# Patient Record
Sex: Male | Born: 1955 | Race: White | Hispanic: No | Marital: Married | Smoking: Never smoker
Health system: Southern US, Community
[De-identification: ages and names within clinical notes are randomized; demographics above are authoritative.]

## PROBLEM LIST (undated history)

## (undated) DIAGNOSIS — G4700 Insomnia, unspecified: Secondary | ICD-10-CM

## (undated) DIAGNOSIS — S86019A Strain of unspecified Achilles tendon, initial encounter: Secondary | ICD-10-CM

## (undated) DIAGNOSIS — Z9889 Other specified postprocedural states: Secondary | ICD-10-CM

## (undated) DIAGNOSIS — G4733 Obstructive sleep apnea (adult) (pediatric): Principal | ICD-10-CM

## (undated) DIAGNOSIS — R159 Full incontinence of feces: Secondary | ICD-10-CM

## (undated) DIAGNOSIS — S0291XA Unspecified fracture of skull, initial encounter for closed fracture: Secondary | ICD-10-CM

## (undated) DIAGNOSIS — K648 Other hemorrhoids: Secondary | ICD-10-CM

## (undated) DIAGNOSIS — R112 Nausea with vomiting, unspecified: Secondary | ICD-10-CM

## (undated) DIAGNOSIS — H269 Unspecified cataract: Secondary | ICD-10-CM

## (undated) DIAGNOSIS — K649 Unspecified hemorrhoids: Secondary | ICD-10-CM

## (undated) DIAGNOSIS — F419 Anxiety disorder, unspecified: Secondary | ICD-10-CM

## (undated) DIAGNOSIS — C801 Malignant (primary) neoplasm, unspecified: Secondary | ICD-10-CM

## (undated) DIAGNOSIS — T7840XA Allergy, unspecified, initial encounter: Secondary | ICD-10-CM

## (undated) HISTORY — DX: Unspecified fracture of skull, initial encounter for closed fracture: S02.91XA

## (undated) HISTORY — DX: Unspecified hemorrhoids: K64.9

## (undated) HISTORY — DX: Other specified postprocedural states: Z98.890

## (undated) HISTORY — DX: Malignant (primary) neoplasm, unspecified: C80.1

## (undated) HISTORY — PX: HEMORRHOID BANDING: SHX5850

## (undated) HISTORY — DX: Obstructive sleep apnea (adult) (pediatric): G47.33

## (undated) HISTORY — DX: Nausea with vomiting, unspecified: R11.2

## (undated) HISTORY — DX: Insomnia, unspecified: G47.00

## (undated) HISTORY — PX: COLONOSCOPY: SHX174

## (undated) HISTORY — DX: Other hemorrhoids: K64.8

## (undated) HISTORY — PX: KNEE CARTILAGE SURGERY: SHX688

## (undated) HISTORY — PX: SIGMOIDOSCOPY: SUR1295

## (undated) HISTORY — PX: ACHILLES TENDON REPAIR: SUR1153

## (undated) HISTORY — DX: Strain of unspecified achilles tendon, initial encounter: S86.019A

## (undated) HISTORY — DX: Allergy, unspecified, initial encounter: T78.40XA

## (undated) HISTORY — DX: Full incontinence of feces: R15.9

## (undated) HISTORY — DX: Unspecified cataract: H26.9

## (undated) HISTORY — DX: Anxiety disorder, unspecified: F41.9

---

## 1998-12-21 ENCOUNTER — Emergency Department (HOSPITAL_COMMUNITY): Admission: EM | Admit: 1998-12-21 | Discharge: 1998-12-21 | Payer: Self-pay | Admitting: Emergency Medicine

## 1998-12-22 ENCOUNTER — Encounter: Payer: Self-pay | Admitting: Emergency Medicine

## 2004-03-19 ENCOUNTER — Ambulatory Visit: Payer: Self-pay | Admitting: Family Medicine

## 2004-10-19 ENCOUNTER — Ambulatory Visit: Payer: Self-pay | Admitting: Family Medicine

## 2004-10-28 ENCOUNTER — Ambulatory Visit: Payer: Self-pay | Admitting: Family Medicine

## 2004-12-29 ENCOUNTER — Encounter: Admission: RE | Admit: 2004-12-29 | Discharge: 2004-12-29 | Payer: Self-pay | Admitting: Orthopedic Surgery

## 2005-07-21 ENCOUNTER — Ambulatory Visit: Payer: Self-pay | Admitting: Family Medicine

## 2005-11-04 ENCOUNTER — Encounter: Admission: RE | Admit: 2005-11-04 | Discharge: 2005-11-04 | Payer: Self-pay | Admitting: Orthopedic Surgery

## 2006-01-23 ENCOUNTER — Ambulatory Visit: Payer: Self-pay | Admitting: Family Medicine

## 2006-02-07 ENCOUNTER — Encounter: Admission: RE | Admit: 2006-02-07 | Discharge: 2006-02-07 | Payer: Self-pay | Admitting: Orthopedic Surgery

## 2006-02-22 ENCOUNTER — Ambulatory Visit: Payer: Self-pay | Admitting: Family Medicine

## 2006-02-22 LAB — CONVERTED CEMR LAB
Albumin: 3.6 g/dL (ref 3.5–5.2)
BUN: 13 mg/dL (ref 6–23)
Basophils Relative: 1.3 % — ABNORMAL HIGH (ref 0.0–1.0)
Bilirubin, Direct: 0.1 mg/dL (ref 0.0–0.3)
Calcium: 9.4 mg/dL (ref 8.4–10.5)
Creatinine, Ser: 0.8 mg/dL (ref 0.4–1.5)
GFR calc non Af Amer: 109 mL/min
HCT: 41.4 % (ref 39.0–52.0)
HDL: 48.2 mg/dL (ref >39.0)
LDL Cholesterol: 117 mg/dL — ABNORMAL HIGH (ref 0–99)
Lymphocytes Relative: 10 % — ABNORMAL LOW (ref 12.0–46.0)
MCV: 87.8 fL (ref 78.0–100.0)
Monocytes Absolute: 0.6 10*3/uL (ref 0.2–0.7)
Monocytes Relative: 6.4 % (ref 3.0–11.0)
PSA: 0.26 ng/mL (ref 0.10–4.00)
Platelets: 199 10*3/uL (ref 150–400)
Potassium: 4.3 meq/L (ref 3.5–5.1)
Total Bilirubin: 0.6 mg/dL (ref 0.3–1.2)
Total Protein: 6.7 g/dL (ref 6.0–8.3)
Triglycerides: 42 mg/dL (ref 0–149)
WBC: 9 10*3/uL (ref 4.5–10.5)

## 2006-03-09 ENCOUNTER — Ambulatory Visit: Payer: Self-pay | Admitting: Family Medicine

## 2006-05-26 ENCOUNTER — Ambulatory Visit: Payer: Self-pay | Admitting: Family Medicine

## 2006-11-08 ENCOUNTER — Ambulatory Visit: Payer: Self-pay | Admitting: Family Medicine

## 2006-11-08 DIAGNOSIS — J029 Acute pharyngitis, unspecified: Secondary | ICD-10-CM

## 2006-11-08 LAB — CONVERTED CEMR LAB: Rapid Strep: NEGATIVE

## 2007-01-12 ENCOUNTER — Encounter: Admission: RE | Admit: 2007-01-12 | Discharge: 2007-01-12 | Payer: Self-pay | Admitting: Orthopedic Surgery

## 2007-02-08 ENCOUNTER — Encounter: Admission: RE | Admit: 2007-02-08 | Discharge: 2007-02-08 | Payer: Self-pay | Admitting: Orthopedic Surgery

## 2007-04-09 DIAGNOSIS — S838X9A Sprain of other specified parts of unspecified knee, initial encounter: Secondary | ICD-10-CM

## 2007-04-09 DIAGNOSIS — S86819A Strain of other muscle(s) and tendon(s) at lower leg level, unspecified leg, initial encounter: Secondary | ICD-10-CM

## 2007-04-11 ENCOUNTER — Ambulatory Visit: Payer: Self-pay | Admitting: Family Medicine

## 2007-09-06 ENCOUNTER — Ambulatory Visit: Payer: Self-pay | Admitting: Family Medicine

## 2007-09-06 LAB — CONVERTED CEMR LAB
ALT: 18 units/L (ref 0–53)
Albumin: 3.8 g/dL (ref 3.5–5.2)
Basophils Relative: 0.2 % (ref 0.0–3.0)
Bilirubin Urine: NEGATIVE
Blood in Urine, dipstick: NEGATIVE
CO2: 27 meq/L (ref 19–32)
Chloride: 108 meq/L (ref 96–112)
Cholesterol: 203 mg/dL (ref 0–200)
Creatinine, Ser: 1 mg/dL (ref 0.4–1.5)
Direct LDL: 117.3 mg/dL
Glucose, Urine, Semiquant: NEGATIVE
HCT: 38.8 % — ABNORMAL LOW (ref 39.0–52.0)
HDL: 51.7 mg/dL (ref >39.0)
MCHC: 34.6 g/dL (ref 30.0–36.0)
MCV: 88.4 fL (ref 78.0–100.0)
Neutro Abs: 2.5 10*3/uL (ref 1.4–7.7)
PSA: 0.26 ng/mL (ref 0.10–4.00)
Potassium: 4 meq/L (ref 3.5–5.1)
Protein, U semiquant: NEGATIVE
Total Bilirubin: 0.8 mg/dL (ref 0.3–1.2)
Total Protein: 6.2 g/dL (ref 6.0–8.3)
Urobilinogen, UA: 0.2
VLDL: 10 mg/dL (ref 0–40)
WBC: 4.7 10*3/uL (ref 4.5–10.5)

## 2007-09-13 ENCOUNTER — Ambulatory Visit: Payer: Self-pay | Admitting: Family Medicine

## 2007-09-18 ENCOUNTER — Ambulatory Visit: Payer: Self-pay | Admitting: Gastroenterology

## 2007-10-17 ENCOUNTER — Ambulatory Visit: Payer: Self-pay | Admitting: Gastroenterology

## 2008-03-03 ENCOUNTER — Telehealth: Payer: Self-pay | Admitting: Family Medicine

## 2009-01-29 ENCOUNTER — Ambulatory Visit: Payer: Self-pay | Admitting: Family Medicine

## 2009-01-29 DIAGNOSIS — G43009 Migraine without aura, not intractable, without status migrainosus: Secondary | ICD-10-CM

## 2009-01-29 DIAGNOSIS — G4720 Circadian rhythm sleep disorder, unspecified type: Secondary | ICD-10-CM

## 2009-01-29 LAB — CONVERTED CEMR LAB
Basophils Absolute: 0 10*3/uL (ref 0.0–0.1)
Bilirubin, Direct: 0.1 mg/dL (ref 0.0–0.3)
Calcium: 9.6 mg/dL (ref 8.4–10.5)
Chloride: 105 meq/L (ref 96–112)
Creatinine, Ser: 0.9 mg/dL (ref 0.4–1.5)
Eosinophils Relative: 0.5 % (ref 0.0–5.0)
Glucose, Bld: 110 mg/dL — ABNORMAL HIGH (ref 70–99)
HCT: 43.8 % (ref 39.0–52.0)
Hemoglobin: 14.4 g/dL (ref 13.0–17.0)
Lymphocytes Relative: 23.4 % (ref 12.0–46.0)
MCHC: 32.8 g/dL (ref 30.0–36.0)
MCV: 90.4 fL (ref 78.0–100.0)
Neutrophils Relative %: 64 % (ref 43.0–77.0)
PSA: 0.33 ng/mL (ref 0.10–4.00)
Platelets: 178 10*3/uL (ref 150.0–400.0)
Potassium: 4.3 meq/L (ref 3.5–5.1)
RBC: 4.85 M/uL (ref 4.22–5.81)
RDW: 12.9 % (ref 11.5–14.6)
Sodium: 141 meq/L (ref 135–145)
TSH: 1.16 microintl units/mL (ref 0.35–5.50)
Total CHOL/HDL Ratio: 5
Total Protein: 7 g/dL (ref 6.0–8.3)
VLDL: 13.4 mg/dL (ref 0.0–40.0)
WBC: 5 10*3/uL (ref 4.5–10.5)

## 2009-03-20 ENCOUNTER — Ambulatory Visit: Payer: Self-pay | Admitting: Family Medicine

## 2009-03-20 DIAGNOSIS — J309 Allergic rhinitis, unspecified: Secondary | ICD-10-CM | POA: Insufficient documentation

## 2009-04-16 ENCOUNTER — Ambulatory Visit: Payer: Self-pay | Admitting: Family Medicine

## 2009-06-24 ENCOUNTER — Ambulatory Visit: Payer: Self-pay | Admitting: Family Medicine

## 2009-11-02 ENCOUNTER — Ambulatory Visit: Payer: Self-pay | Admitting: Family Medicine

## 2009-11-02 DIAGNOSIS — M79609 Pain in unspecified limb: Secondary | ICD-10-CM | POA: Insufficient documentation

## 2009-11-03 ENCOUNTER — Telehealth: Payer: Self-pay | Admitting: Family Medicine

## 2010-02-09 NOTE — Progress Notes (Signed)
Summary: Pt req to get copy of xray sent over to Dr. Budd Palmer office  Phone Note Call from Patient Call back at Work Phone [phone]   Caller: Patient Summary of Call: Pt is req that a copy of his xray results for pts had be sent to Dr Teressa Senter with [facility]. Pt has appt with Dr. Teressa Senter tomorrow and Dr Teressa Senter wants to see xray during appt. Pls call. Pt says that is this can be sent electroinically, that would be great. Otherwise, he would like to pick up copy to take with him. Please notify pt.   Initial call taken by: Lucy Antigua,  November 03, 2009 3:04 PM  Follow-up for Phone Call        fax sent Follow-up by: Kern Reap CMA Duncan Dull),  November 03, 2009 4:46 PM  Additional Follow-up for Phone Call Additional follow up Details #1::        Phone Call Completed Additional Follow-up by: Kern Reap CMA Duncan Dull),  November 03, 2009 4:50 PM

## 2010-02-09 NOTE — Assessment & Plan Note (Signed)
Summary: COUGH, CONGESTION / RS   Vital Signs:  Patient profile:   55 year old male Weight:      149 pounds Temp:     98.6 degrees F oral BP sitting:   102 / 64  (left arm) Cuff size:   regular  Vitals Entered By: Kern Reap CMA Duncan Dull) (March 20, 2009 12:34 PM)  Reason for Visit sinus pressure, ear pressure, sore throat  Primary Care Provider:  Kelle Darting, MD   History of Present Illness: William Norris is a 55 year old male with a 5-day history of head congestion, postnasal drip, cough, sinus pressure, and popping in his ears.  He has no symptoms of a virus.  He said a history of seasonal allergic rhinitis, which is typically worse in the spring.  Allergic history reviewed.  Environmental history negative  Allergies: No Known Drug Allergies  Past History:  Past medical, surgical, family and social histories (including risk factors) reviewed for relevance to current acute and chronic problems.  Past Medical History: Reviewed history from 11/08/2006 and no changes required.  migraines knee surgery x 2.  Cartilage  skull fracture.  No neurologic sequelae torn right Achilles' tendon  Family History: Reviewed history from 09/18/2007 and no changes required. brother with glaucoma father died at age 5, CNS embolus.  Smoker mother has osteoporosis one sister in good health Family History of Irritable Bowel Syndrome: Mother   Social History: Reviewed history from 09/18/2007 and no changes required. Occupation:[employer] Married Never Smoked Alcohol use-Rare Drug use-no Regular exercise-yes Daily Caffeine Use 2-3 coffee/day  Review of Systems      See HPI  Physical Exam  General:  Well-developed,well-nourished,in no acute distress; alert,appropriate and cooperative throughout examination Head:  Normocephalic and atraumatic without obvious abnormalities. No apparent alopecia or balding. Eyes:  No corneal or conjunctival inflammation noted. EOMI. Perrla. Funduscopic  exam benign, without hemorrhages, exudates or papilledema. Vision grossly normal. Ears:  External ear exam shows no significant lesions or deformities.  Otoscopic examination reveals clear canals, tympanic membranes are intact bilaterally without bulging, retraction, inflammation or discharge. Hearing is grossly normal bilaterally. Nose:  septum in the midline, 3+ nasal edema Mouth:  Oral mucosa and oropharynx without lesions or exudates.  Teeth in good repair. Neck:  No deformities, masses, or tenderness noted. Chest Wall:  No deformities, masses, tenderness or gynecomastia noted. Lungs:  Normal respiratory effort, chest expands symmetrically. Lungs are clear to auscultation, no crackles or wheezes.   Problems:  Medical Problems Added: 1)  Dx of Rhinitis  (ICD-477.9)  Impression & Recommendations:  Problem # 1:  RHINITIS (ICD-477.9) Assessment New  His updated medication list for this problem includes:    Flonase 50 Mcg/act Susp (Fluticasone propionate) ..... Uad  Orders: Prescription Created Electronically [phone])  Complete Medication List: 1)  Zomig Zmt 5 Mg Tbdp (Zolmitriptan) .... As needed miraine 2)  Doxycycline Hyclate 50 Mg Caps (Doxycycline hyclate) .... As needed for skin 3)  Adult Aspirin Ec Low Strength 81 Mg Tbec (Aspirin) .... Take 1 tablet by mouth once a day 4)  Multivitamins Tabs (Multiple vitamin) .... Take 1 tablet by mouth once a day 5)  Celexa 20 Mg Tabs (Citalopram hydrobromide) .Marland Kitchen.. 1 tab @ bedtime 6)  Flonase 50 Mcg/act Susp (Fluticasone propionate) .... Uad  Patient Instructions: 1)  take 10 mg of plain Claritin in the morning or 10 mg of plain Zyrtec at bedtime, along with the nasal irrigation......... afrin nasal spray........ saltwater irrigation with a netti pot......... steroid nasal spray, one  shot up each nostril, then go immediately to bed . 2)  If in a week u  don't see any improvement, then, we would start you on a short course of oral  prednisone........... call if that's the case Prescriptions: FLONASE 50 MCG/ACT SUSP (FLUTICASONE PROPIONATE) UAD  #1 x 6   Entered and Authorized by:   Roderick Pee MD   Signed by:   Roderick Pee MD on 03/20/2009   Method used:   Electronically to        [facility] [address] [phone]* (retail)       [address]       [place], [place]  [address]       Ph: [phone] or [phone]       Fax: [phone]   RxID:   [id number]
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
## 2010-02-09 NOTE — Assessment & Plan Note (Signed)
Summary: CPX // RS   Vital Signs:  Patient profile:   55 year old male Height:      67 inches Weight:      143 pounds Temp:     98.0 degrees F oral BP sitting:   110 / 72  (left arm) Cuff size:   regular  Vitals Entered By: Kern Reap CMA Duncan Dull) (April 16, 2009 10:51 AM) CC: cpx   Primary Care Provider:  Kelle Darting, MD  CC:  cpx.  History of Present Illness: William Norris is a 55 year old, married male, nonsmoker avid mountain biker who comes in today for general physical exam.  As always been in excellent health.  Has had no chronic health problems except migraine headaches.  He takes Zomig p.r.n. and it works if he takes it right away.  He also takes Celexa 20 mg nightly for sleep dysfunction.  Sleeping well.  He also uses Flonase nasal spray p.r.n. for allergic rhinitis.  He takes an aspirin tablet daily.  He gets routine eye care.  Dental care.  Colonoscopy done in GI normal, tetanus, 2006, seasonal flu shot 2010  On his mountain bike.  He gets his heart rate up to [address]180.  No chest pain, however, if he gets over 180.  He states he gets a little short of breath.  Allergies (verified): No Known Drug Allergies  Past History:  Past medical, surgical, family and social histories (including risk factors) reviewed, and no changes noted (except as noted below).  Past Medical History: Reviewed history from 11/08/2006 and no changes required.  migraines knee surgery x 2.  Cartilage  skull fracture.  No neurologic sequelae torn right Achilles' tendon  Family History: Reviewed history from 09/18/2007 and no changes required. brother with glaucoma father died at age 54, CNS embolus.  Smoker mother has osteoporosis one sister in good health Family History of Irritable Bowel Syndrome: Mother   Social History: Reviewed history from 09/18/2007 and no changes required. Occupation:[employer] Married Never Smoked Alcohol use-Rare Drug use-no Regular exercise-yes Daily Caffeine  Use 2-3 coffee/day  Review of Systems      See HPI  Physical Exam  General:  Well-developed,well-nourished,in no acute distress; alert,appropriate and cooperative throughout examination Head:  Normocephalic and atraumatic without obvious abnormalities. No apparent alopecia or balding. Eyes:  No corneal or conjunctival inflammation noted. EOMI. Perrla. Funduscopic exam benign, without hemorrhages, exudates or papilledema. Vision grossly normal. Ears:  External ear exam shows no significant lesions or deformities.  Otoscopic examination reveals clear canals, tympanic membranes are intact bilaterally without bulging, retraction, inflammation or discharge. Hearing is grossly normal bilaterally. Nose:  External nasal examination shows no deformity or inflammation. Nasal mucosa are pink and moist without lesions or exudates. Mouth:  Oral mucosa and oropharynx without lesions or exudates.  Teeth in good repair. Neck:  No deformities, masses, or tenderness noted. Chest Wall:  No deformities, masses, tenderness or gynecomastia noted. Breasts:  No masses or gynecomastia noted Lungs:  Normal respiratory effort, chest expands symmetrically. Lungs are clear to auscultation, no crackles or wheezes. Heart:  Normal rate and regular rhythm. S1 and S2 normal without gallop, murmur, click, rub or other extra sounds. Abdomen:  Bowel sounds positive,abdomen soft and non-tender without masses, organomegaly or hernias noted. Rectal:  No external abnormalities noted. Normal sphincter tone. No rectal masses or tenderness. Genitalia:  Testes bilaterally descended without nodularity, tenderness or masses. No scrotal masses or lesions. No penis lesions or urethral discharge. Prostate:  Prostate gland firm and smooth,  no enlargement, nodularity, tenderness, mass, asymmetry or induration. Msk:  No deformity or scoliosis noted of thoracic or lumbar spine.   Pulses:  R and L carotid,radial,femoral,dorsalis pedis and  posterior tibial pulses are full and equal bilaterally Extremities:  No clubbing, cyanosis, edema, or deformity noted with normal full range of motion of all joints.   Neurologic:  No cranial nerve deficits noted. Station and gait are normal. Plantar reflexes are down-going bilaterally. DTRs are symmetrical throughout. Sensory, motor and coordinative functions appear intact. Skin:  Intact without suspicious lesions or rashes Cervical Nodes:  No lymphadenopathy noted Axillary Nodes:  No palpable lymphadenopathy Inguinal Nodes:  No significant adenopathy Psych:  Cognition and judgment appear intact. Alert and cooperative with normal attention span and concentration. 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 As needed miraine    Adult Aspirin Ec Low Strength 81 Mg Tbec (Aspirin) .Marland Kitchen... Take 1 tablet by mouth once a day  Orders: Prescription Created Electronically [phone]) EKG w/ Interpretation (93000)  Problem # 3:  HEALTH MAINTENANCE EXAM (ICD-V70.0) Assessment: Unchanged  Orders: Prescription Created Electronically [phone])  Complete Medication List: 1)  Zomig Zmt 5 Mg Tbdp (Zolmitriptan) .... As needed miraine 2)  Adult Aspirin Ec Low Strength 81 Mg Tbec (Aspirin) .... Take 1 tablet by mouth once a day 3)  Multivitamins Tabs (Multiple vitamin) .... Take 1 tablet by mouth once a day 4)  Celexa 20 Mg Tabs (Citalopram hydrobromide) .Marland Kitchen.. 1 tab @ bedtime 5)  Flonase 50 Mcg/act Susp (Fluticasone propionate) .... Uad  Patient Instructions: 1)  Please schedule a follow-up appointment in 1 year. 2)  Take  an Aspirin every day. Prescriptions: FLONASE 50 MCG/ACT SUSP (FLUTICASONE PROPIONATE) UAD  #1 x 6   Entered and Authorized by:   Roderick Pee MD   Signed by:   Roderick Pee MD on 04/16/2009   Method used:   Electronically to        [facility] [address] [phone]* (retail)       [address]       [place], [place]  [address]       Ph: [phone] or [phone]       Fax: [phone]   RxID:   [id number] CELEXA 20 MG TABS (CITALOPRAM HYDROBROMIDE) 1 tab @ bedtime  #100 x 3   Entered and Authorized by:   Roderick Pee MD   Signed by:   Roderick Pee MD on 04/16/2009   Method used:   Electronically to        [facility] [address] [phone]* (retail)       [address]       [place], [place]  [address]       Ph: [phone] or [phone]       Fax: [phone]   RxID:   [id number] ZOMIG ZMT 5 MG  TBDP (ZOLMITRIPTAN) as needed miraine  #6 Tablet x 11   Entered and Authorized by:   Roderick Pee MD   Signed by:   Roderick Pee MD  on 04/16/2009   Method used:   Electronically to        [facility] [address] [phone]* (retail)       [address]       [place], [place]  [address]       Ph: [phone] or [phone]       Fax: [phone]   RxID:   [id number]    Immunization History:  Influenza Immunization History:    Influenza:  historical (10/10/2008)  Tetanus/Td Immunization History:    Tetanus/Td:  historical (01/11/2004)

## 2010-02-09 NOTE — Assessment & Plan Note (Signed)
Summary: med check/refills/cjr   Vital Signs:  Patient profile:   55 year old male Height:      67.25 inches Weight:      147 pounds BMI:     22.94 Temp:     98.7 degrees F oral BP sitting:   118 / 76  (left arm) Cuff size:   regular  Vitals Entered By: Kern Reap CMA Duncan Dull) (January 29, 2009 11:39 AM)  Reason for Visit med check, sleep concerns  Primary Care Provider:  Kelle Darting, MD   History of Present Illness: William Norris is a 55 year old, married male, who comes in today for evaluation of 3 problems.  We lasted his physical examination about two years ago.  His underlying migraine headaches for which he takes Zomig 5 mg stat.  His migraines occur about every two to 3 months.  They usually wake him up in the middle of the night.  However, if he takes a Zomig, right away.  The headache goes away.  Is also having difficulty sleeping.  He goes to sleep okay but wakes up and can go back to sleep.  He is currently taking Benadryl nightly however, he says it makes him groggy in the morning.  He would like to discuss other options.  The third problem is change in bowel habits.  For the last 9 months.  He says his bowel movements.  He got another loose.  He only goes once daily.  No blood in the stools and had a normal colonoscopy.  Last year.  No fevss, etc..  Allergies: No Known Drug Allergies  Past History:  Past medical, surgical, family and social histories (including risk factors) reviewed, and no changes noted (except as noted below).  Past Medical History: Reviewed history from 11/08/2006 and no changes required.  migraines knee surgery x 2.  Cartilage  skull fracture.  No neurologic sequelae torn right Achilles' tendon  Family History: Reviewed history from 09/18/2007 and no changes required. brother with glaucoma father died at age 23, CNS embolus.  Smoker mother has osteoporosis one sister in good health Family History of Irritable Bowel Syndrome: Mother    Social History: Reviewed history from 09/18/2007 and no changes required. Occupation:[employer] Married Never Smoked Alcohol use-Rare Drug use-no Regular exercise-yes Daily Caffeine Use 2-3 coffee/day  Review of Systems      See HPI  Physical Exam  General:  Well-developed,well-nourished,in no acute distress; alert,appropriate and cooperative throughout examination Psych:  Cognition and judgment appear intact. Alert and cooperative with normal attention span and concentration. 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 As needed miraine    Adult Aspirin Ec Low Strength 81 Mg Tbec (Aspirin) .Marland Kitchen... Take 1 tablet by mouth once a day  Orders: Venipuncture (09811) Prescription Created Electronically [phone]) UA Dipstick w/o Micro (automated)  (81003)  Complete Medication List: 1)  Zomig Zmt 5 Mg Tbdp (Zolmitriptan) .... As needed miraine 2)  Doxycycline Hyclate 50 Mg Caps (Doxycycline hyclate) .... As needed for skin 3)  Adult Aspirin Ec Low Strength 81 Mg Tbec (Aspirin) .... Take 1 tablet by mouth once a day 4)  Multivitamins Tabs (Multiple vitamin) .Marland KitchenMarland KitchenMarland Kitchen  Take 1 tablet by mouth once a day 5)  Celexa 20 Mg Tabs (Citalopram hydrobromide) .Marland Kitchen.. 1 tab @ bedtime  Other Orders: TLB-Lipid Panel (80061-LIPID) TLB-BMP (Basic Metabolic Panel-BMET) (80048-METABOL) TLB-CBC Platelet - w/Differential (85025-CBCD) TLB-Hepatic/Liver Function Pnl (80076-HEPATIC) TLB-TSH (Thyroid Stimulating Hormone) (84443-TSH) TLB-PSA (Prostate Specific Antigen) (84153-PSA)  Patient  Instructions: 1)  continued the Zomig as you are currently taking it. 2)  Begin Celexa 20 mg one half tablet nightly if after 4 to 6 weeks  u are sleeping well.  Continue a half a tablet.  If however, after 4 to 6 weeks u are  not sleeping well.  Increase the dose to a full 20-mg tablet at bedtime.  3)  We will get your lab work today.  Set up a time in the next 3 to 4 weeks for a 30 minute appointment for general medical exam Prescriptions: ZOMIG ZMT 5 MG  TBDP (ZOLMITRIPTAN) as needed miraine  #6 Tablet x 11   Entered and Authorized by:   Roderick Pee MD   Signed by:   Roderick Pee MD on 01/29/2009   Method used:   Electronically to        [facility] [address] [phone]* (retail)       [address]       [place], [place]  [address]       Ph: [phone] or [phone]       Fax: [phone]   RxID:   [id number] CELEXA 20 MG TABS (CITALOPRAM HYDROBROMIDE) 1 tab @ bedtime  #100 x 3   Entered and Authorized by:   Roderick Pee MD   Signed by:   Roderick Pee MD on 01/29/2009   Method used:   Electronically to        [facility] [address] [phone]* (retail)       [address]       [place], [place]  [address]       Ph: [phone] or [phone]       Fax: [phone]   RxID:   [id number]   Appended Document: med check/refills/cjr  Laboratory Results   Urine Tests    Routine Urinalysis   Color: yellow Appearance: Clear Glucose: negative   (Normal Range: Negative) Bilirubin: negative   (Normal Range: Negative) Ketone: negative   (Normal Range: Negative) Spec. Gravity: 1.015   (Normal Range: 1.003-1.035) Blood: negative   (Normal Range: Negative) pH: 7.0   (Normal Range: 5.0-8.0) Protein: negative   (Normal Range: Negative) Urobilinogen: 0.2   (Normal Range: 0-1) Nitrite: negative   (Normal Range: Negative) Leukocyte Esterace: negative   (Normal Range: Negative)    Comments: Rita Ohara  January 30, 2009 8:16 AM

## 2010-02-09 NOTE — Assessment & Plan Note (Signed)
Summary: congestion//ccm   Vital Signs:  Patient profile:   55 year old male Weight:      147 pounds Temp:     98.1 degrees F oral BP sitting:   100 / 60  (left arm) Cuff size:   regular  Vitals Entered By: Kathrynn Speed CMA (June 24, 2009 4:11 PM) CC: Sinus Congestion   History of Present Illness: 10 day history sinus congestion. Green nasal mucus. No fever. Intermittent chills. Sore throat. Occasional mild productive cough. Has taken over-the-counter antihistamines without improvement. Nonsmoker. No significant headache  Current Medications (verified): 1)  Zomig Zmt 5 Mg  Tbdp (Zolmitriptan) .... As Needed Miraine 2)  Adult Aspirin Ec Low Strength 81 Mg Tbec (Aspirin) .... Take 1 Tablet By Mouth Once A Day 3)  Multivitamins  Tabs (Multiple Vitamin) .... Take 1 Tablet By Mouth Once A Day 4)  Celexa 20 Mg Tabs (Citalopram Hydrobromide) .Marland Kitchen.. 1 Tab @ Bedtime 5)  Flonase 50 Mcg/act Susp (Fluticasone Propionate) .... Uad  Allergies (verified): No Known Drug Allergies  Past History:  Past Medical History: Last updated: 11/08/2006  migraines knee surgery x 2.  Cartilage  skull fracture.  No neurologic sequelae torn right Achilles' tendon  Review of Systems      See HPI  Physical Exam  General:  Well-developed,well-nourished,in no acute distress; alert,appropriate and cooperative throughout examination Head:  Normocephalic and atraumatic without obvious abnormalities. No apparent alopecia or balding. Ears:  External ear exam shows no significant lesions or deformities.  Otoscopic examination reveals clear canals, tympanic membranes are intact bilaterally without bulging, retraction, inflammation or discharge. Hearing is grossly normal bilaterally. Nose:  erythematous nasal mucosa otherwise normal Mouth:  minimal erythema without exudate Neck:  No deformities, masses, or tenderness noted. Lungs:  Normal respiratory effort, chest expands symmetrically. Lungs are clear to  auscultation, no crackles or wheezes. Heart:  Normal rate and regular rhythm. S1 and S2 normal without gallop, murmur, click, rub or other extra sounds.   Impression & Recommendations:  Problem # 1:  SINUSITIS, ACUTE (ICD-461.9) ?viral but symptoms > 10 days.  Start Amox. if symptoms not improved in 2-3 days. His updated medication list for this problem includes:    Flonase 50 Mcg/act Susp (Fluticasone propionate) ..... Uad    Amoxicillin 875 Mg Tabs (Amoxicillin) ..... One by mouth two times a day for 10 days  Complete Medication List: 1)  Zomig Zmt 5 Mg Tbdp (Zolmitriptan) .... As needed miraine 2)  Adult Aspirin Ec Low Strength 81 Mg Tbec (Aspirin) .... Take 1 tablet by mouth once a day 3)  Multivitamins Tabs (Multiple vitamin) .... Take 1 tablet by mouth once a day 4)  Celexa 20 Mg Tabs (Citalopram hydrobromide) .Marland Kitchen.. 1 tab @ bedtime 5)  Flonase 50 Mcg/act Susp (Fluticasone propionate) .... Uad 6)  Amoxicillin 875 Mg Tabs (Amoxicillin) .... One by mouth two times a day for 10 days  Patient Instructions: 1)  Acute sinusitis symptoms for less than 10 days are not helped by antibiotics. Use warm moist compresses, and over the counter decongestants( only as directed). Call if no improvement in 5-7 days, sooner if increasing pain, fever, or new symptoms.  Prescriptions: AMOXICILLIN 875 MG TABS (AMOXICILLIN) one by mouth two times a day for 10 days  #20 x 0   Entered and Authorized by:   Evelena Peat MD   Signed by:   Evelena Peat MD on 06/24/2009   Method used:   Print then Give to Patient   RxID:  [id number]  

## 2010-06-08 ENCOUNTER — Other Ambulatory Visit: Payer: Self-pay | Admitting: Family Medicine

## 2010-08-02 ENCOUNTER — Encounter: Payer: Self-pay | Admitting: Family Medicine

## 2010-08-02 ENCOUNTER — Ambulatory Visit (INDEPENDENT_AMBULATORY_CARE_PROVIDER_SITE_OTHER): Payer: BC Managed Care – PPO | Admitting: Family Medicine

## 2010-08-02 VITALS — BP 94/62 | HR 64 | Temp 97.8°F | Ht 67.0 in | Wt 148.0 lb

## 2010-08-02 DIAGNOSIS — M5136 Other intervertebral disc degeneration, lumbar region with discogenic back pain only: Secondary | ICD-10-CM | POA: Insufficient documentation

## 2010-08-02 DIAGNOSIS — M545 Low back pain: Secondary | ICD-10-CM

## 2010-08-02 MED ORDER — CYCLOBENZAPRINE HCL 5 MG PO TABS: ORAL_TABLET | ORAL | Status: DC

## 2010-08-02 MED ORDER — TRAMADOL HCL 50 MG PO TABS: ORAL_TABLET | ORAL | Status: DC

## 2010-08-02 MED ORDER — PREDNISONE 20 MG PO TABS: ORAL_TABLET | ORAL | Status: DC

## 2010-08-02 NOTE — Progress Notes (Signed)
  Subjective:    Patient ID: William Norris, male    DOB: 1955/10/17, 55 y.o.   MRN: [id number]  HPIGery is a [age]-year-old, married male, nonsmoker, who comes in today for evaluation of left lower lumbar back pain for two weeks.  Two weeks ago.  He was playing basketball with his son and experienced left lower lumbar back pain.  Since that time.  The pain as being constant, but it varies in intensity from a to do is 6.  It sometimes keeps him awake at night.  He describes it as dull, not sharp and does not radiate.  He said a history of cervical disk disease in the past that resolved with physical therapy, but alternately had had epidural steroid injections.    Review of Systems    General no logic, and musculoskeletal review of systems otherwise negative Objective:   Physical Exam    Well-developed thin male, in no acute distress.  Examination lumbar spine.  Chest no palpable tenderness to this palpable tenderness.  The left SI notch.  In the supine position.  Muscle strength reflexes, sensation, all normal.  Straight leg raising negative    Assessment & Plan:  Lumbar disease.  Plan prednisone burst and taper physical therapy consider epidural steroid injections.  If symptoms persist

## 2010-08-02 NOTE — Patient Instructions (Signed)
Take the prednisone as directed.  One half or one of the Flexeril and Ultram at bedtime as needed.  We will get she is set up for physical therapy.  If your symptoms persist after physical therapy return to see me for follow-up

## 2011-05-29 ENCOUNTER — Other Ambulatory Visit: Payer: Self-pay | Admitting: Family Medicine

## 2011-12-17 ENCOUNTER — Other Ambulatory Visit: Payer: Self-pay | Admitting: Family Medicine

## 2011-12-19 ENCOUNTER — Encounter: Payer: Self-pay | Admitting: Family Medicine

## 2011-12-19 ENCOUNTER — Ambulatory Visit (INDEPENDENT_AMBULATORY_CARE_PROVIDER_SITE_OTHER): Payer: BC Managed Care – PPO | Admitting: Family Medicine

## 2011-12-19 VITALS — BP 120/80 | Temp 98.2°F | Wt 150.0 lb

## 2011-12-19 DIAGNOSIS — Z23 Encounter for immunization: Secondary | ICD-10-CM

## 2011-12-19 DIAGNOSIS — M25519 Pain in unspecified shoulder: Secondary | ICD-10-CM

## 2011-12-19 DIAGNOSIS — M25512 Pain in left shoulder: Secondary | ICD-10-CM | POA: Insufficient documentation

## 2011-12-19 DIAGNOSIS — M5136 Other intervertebral disc degeneration, lumbar region: Secondary | ICD-10-CM

## 2011-12-19 DIAGNOSIS — M545 Low back pain: Secondary | ICD-10-CM

## 2011-12-19 MED ORDER — TRAMADOL HCL 50 MG PO TABS: ORAL_TABLET | ORAL | Status: DC

## 2011-12-19 MED ORDER — PREDNISONE 20 MG PO TABS: ORAL_TABLET | ORAL | Status: DC

## 2011-12-19 NOTE — Progress Notes (Signed)
  Subjective:    Patient ID: William Norris, male    DOB: [birth year], 56 y.o.   MRN: [id number]  HPI Press is a 56 year old male married nonsmoker who comes in with a 3 to four-month history of pain in his left shoulder no history of trauma. He states he was swimming in the next day noticed some soreness in his shoulder and it's not gone away. His pain varies from a 2 to was 7.   Review of Systems General and musculoskeletal review of systems otherwise negative    Objective:   Physical Exam Well-developed well-nourished male in no acute distress examination of the shoulder shows full range of motion some tenderness in the shoulder joint itself otherwise normal exam       Assessment & Plan:  Pain left shoulder probably a little bursitis plan prednisone burst and taper return when necessary

## 2011-12-19 NOTE — Patient Instructions (Signed)
Take a short course of prednisone as outlined  If we don't see any improvement then I would recommend a consult with Dr. Norlene Campbell orthopedist

## 2012-02-10 ENCOUNTER — Ambulatory Visit (INDEPENDENT_AMBULATORY_CARE_PROVIDER_SITE_OTHER): Payer: BC Managed Care – PPO | Admitting: Family

## 2012-02-10 ENCOUNTER — Encounter: Payer: Self-pay | Admitting: Family

## 2012-02-10 VITALS — BP 100/78 | HR 72 | Temp 98.1°F | Wt 151.0 lb

## 2012-02-10 DIAGNOSIS — J019 Acute sinusitis, unspecified: Secondary | ICD-10-CM

## 2012-02-10 DIAGNOSIS — R5383 Other fatigue: Secondary | ICD-10-CM

## 2012-02-10 MED ORDER — AMOXICILLIN 500 MG PO TABS: 1000.0000 mg | ORAL_TABLET | Freq: Two times a day (BID) | ORAL | Status: AC

## 2012-02-10 NOTE — Progress Notes (Signed)
Subjective:    Patient ID: William Norris, male    DOB: [birth year], 56 y.o.   MRN: [id number]  HPI 57 year old white male, nonsmoker, patient of Dr. Tawanna Cooler is in today with complaints of congestion, sore throat, sinus pressure and pain x2-1/2 weeks. 3-4 days ago his symptoms appear to be clearing uses Sudafed and aspirin but today, his symptoms returned and are worse. Reports tenderness to his face. Denies any fever, muscle aches, or joint aches.   Review of Systems  Constitutional: Positive for fatigue.  HENT: Positive for congestion, sore throat, rhinorrhea, postnasal drip and sinus pressure.   Respiratory: Positive for cough.   Cardiovascular: Negative.   Musculoskeletal: Negative.   Skin: Negative.   Neurological: Negative.   Hematological: Negative.   Psychiatric/Behavioral: Negative.    Past Medical History  Diagnosis Date  . Migraine   . Skull fracture     no neurologic sequelae  . Torn Achilles tendon     right    History   Social History  . Marital Status: Married    Spouse Name: N/A    Number of Children: N/A  . Years of Education: N/A   Occupational History  . Not on file.   Social History Main Topics  . Smoking status: Never Smoker   . Smokeless tobacco: Not on file  . Alcohol Use:   . Drug Use:   . Sexually Active:    Other Topics Concern  . Not on file   Social History Narrative  . No narrative on file    Past Surgical History  Procedure Date  . Knee cartilage surgery     x2    Family History  Problem Relation Age of Onset  . Osteoporosis Mother   . Irritable bowel syndrome Mother   . Glaucoma Brother     No Known Allergies  Current Outpatient Prescriptions on File Prior to Visit  Medication Sig Dispense Refill  . aspirin 81 MG tablet Take 81 mg by mouth daily.        . multivitamin (THERAGRAN) per tablet Take 1 tablet by mouth daily.        . traMADol (ULTRAM) 50 MG tablet One p.o. nightly  30 tablet  1  . zolmitriptan (ZOMIG-ZMT) 5  MG disintegrating tablet TAKE AS NEEDED FOR MIGRAINE  30 tablet  3  . cyclobenzaprine (FLEXERIL) 5 MG tablet One p.o. nightly  30 tablet  1  . fluticasone (FLONASE) 50 MCG/ACT nasal spray Place 2 sprays into the nose daily.        . predniSONE (DELTASONE) 20 MG tablet Two tabs x 3 days, one x 3 days, a half x 3 days, then half a tablet Monday, Wednesday, Friday, for a two-week taper  40 tablet  1    BP 100/78  Pulse 72  Temp 98.1 F (36.7 C) (Oral)  Wt 151 lb (68.493 kg)  SpO2 98%chart    Objective:   Physical Exam  Constitutional: He is oriented to person, place, and time. He appears well-developed and well-nourished.  HENT:  Right Ear: External ear normal.  Left Ear: External ear normal.       Maxillary sinus tenderness to palpation bilaterally  Neck: Normal range of motion. Neck supple.  Cardiovascular: Normal rate, regular rhythm and normal heart sounds.   Pulmonary/Chest: Effort normal and breath sounds normal.  Neurological: He is alert and oriented to person, place, and time.  Skin: Skin is warm and dry.  Psychiatric: He has a normal mood  and affect.          Assessment & Plan:  Assessment:  1. Acute sinusitis 2. Fatigue  Plan: Flonase 2 sprays in each nostril once a day. Patient reports having this medication at home. Amoxicillin 2 capsules by mouth twice a day x10 days. Rest. Drink plenty of fluids. Patient call the office if symptoms worsen or persist. Recheck a schedule, and as needed.

## 2012-02-10 NOTE — Patient Instructions (Addendum)

## 2012-12-23 ENCOUNTER — Other Ambulatory Visit: Payer: Self-pay | Admitting: Family Medicine

## 2013-02-22 ENCOUNTER — Ambulatory Visit (INDEPENDENT_AMBULATORY_CARE_PROVIDER_SITE_OTHER): Payer: BC Managed Care – PPO | Admitting: Family

## 2013-02-22 ENCOUNTER — Encounter: Payer: Self-pay | Admitting: Family

## 2013-02-22 VITALS — BP 100/62 | HR 72 | Wt 154.0 lb

## 2013-02-22 DIAGNOSIS — M7661 Achilles tendinitis, right leg: Secondary | ICD-10-CM

## 2013-02-22 DIAGNOSIS — M7660 Achilles tendinitis, unspecified leg: Secondary | ICD-10-CM

## 2013-02-22 MED ORDER — MELOXICAM 15 MG PO TABS: 15.0000 mg | ORAL_TABLET | Freq: Every day | ORAL | Status: DC

## 2013-02-22 NOTE — Progress Notes (Signed)
   Subjective:    Patient ID: William Norris, male    DOB: 1955/05/11, 58 y.o.   MRN: [id number]  Foot Injury    58 year old white male, nonsmoker is in today with complaints of right Achilles heel pain that initially started 3 weeks ago after playing basketball. The symptoms seemed to have resolved then 2 days ago he was playing basketball again and developed a shooting pain in his right Achilles heel. He has a history of a ruptured Achilles tendon 25 years ago that was repaired. Recent pain is 6/10 worse with standing and certain movements. He is able to ambulate and bear weight. He has not taken any medication for relief.   Review of Systems  Constitutional: Negative.   Respiratory: Negative.   Genitourinary: Negative.   Musculoskeletal:       Right achilles heel pain   Skin: Negative.   Neurological: Negative.   Hematological: Negative.   Psychiatric/Behavioral: Negative.        Objective:   Physical Exam  Constitutional: He is oriented to person, place, and time. He appears well-developed and well-nourished.  Neck: Normal range of motion. Neck supple.  Cardiovascular: Normal rate, regular rhythm and normal heart sounds.   Pulmonary/Chest: Effort normal and breath sounds normal.  Musculoskeletal: He exhibits tenderness. He exhibits no edema.  Right achilles heel pain to palpation. No obvious swelling. Full ROM without pain.   Neurological: He is alert and oriented to person, place, and time.  Skin: Skin is warm and dry.  Psychiatric: He has a normal mood and affect.          Assessment & Plan:  William Norris was seen today for foot injury.  Diagnoses and associated orders for this visit:  Tendonitis, Achilles, right  Other Orders - meloxicam (MOBIC) 15 MG tablet; Take 1 tablet (15 mg total) by mouth daily.    Call the office if any questions or concerns. Consider MRI if symptoms persist.

## 2013-02-22 NOTE — Progress Notes (Signed)
Pre visit review using our clinic review tool, if applicable. No additional management support is needed unless otherwise documented below in the visit note. 

## 2013-02-22 NOTE — Patient Instructions (Signed)
Achilles Tendinitis °Achilles tendinitis is inflammation of the tough, cord-like band that attaches the lower muscles of your leg to your heel (Achilles tendon). It is usually caused by overusing the tendon and joint involved.  °CAUSES °Achilles tendinitis can happen because of: °· A sudden increase in exercise or activity (such as running). °· Doing the same exercises or activities (such as jumping) over and over. °· Not warming up calf muscles before exercising. °· Exercising in shoes that are worn out or not made for exercise. °· Having arthritis or a bone growth on the back of the heel bone. This can rub against the tendon and hurt the tendon. °SIGNS AND SYMPTOMS °The most common symptoms are: °· Pain in the back of the leg, just above the heel. The pain usually gets worse with exercise and better with rest. °· Stiffness or soreness in the back of the leg, especially in the morning. °· Swelling of the skin over the Achilles tendon. °· Trouble standing on tiptoe. °Sometimes, an Achilles tendon tears (ruptures). Symptoms of an Achilles tendon rupture can include: °· Sudden, severe pain in the back of the leg. °· Trouble putting weight on the foot or walking normally. °DIAGNOSIS °Achilles tendinitis will be diagnosed based on symptoms and a physical examination. An X-ray may be done to check if another condition is causing your symptoms. An MRI may be ordered if your health care provider suspects you may have completely torn your tendon, which is called an Achilles tendon rupture.  °TREATMENT  °Achilles tendinitis usually gets better over time. It can take weeks to months to heal completely. Treatment focuses on treating the symptoms and helping the injury heal. °HOME CARE INSTRUCTIONS  °· Rest your Achilles tendon and avoid activities that cause pain. °· Apply ice to the injured area: °· Put ice in a plastic bag. °· Place a towel between your skin and the bag. °· Leave the ice on for 20 minutes, 2 3 times a  day °· Try to avoid using the tendon (other than gentle range of motion) while the tendon is painful. Do not resume use until instructed by your health care provider. Then begin use gradually. Do not increase use to the point of pain. If pain does develop, decrease use and continue the above measures. Gradually increase activities that do not cause discomfort until you achieve normal use. °· Do exercises to make your calf muscles stronger and more flexible. Your health care provider or physical therapist can recommend exercises for you to do. °· Wrap your ankle with an elastic bandage or other wrap. This can help keep your tendon from moving too much. Your health care provider will show you how to wrap your ankle correctly. °· Only take over-the-counter or prescription medicines for pain, discomfort, or fever as directed by your health care provider. °SEEK MEDICAL CARE IF:  °· Your pain and swelling increase or pain is uncontrolled with medicines. °· You develop new, unexplained symptoms or your symptoms get worse. °· You are unable to move your toes or foot. °· You develop warmth and swelling in your foot. °· You have an unexplained temperature. °MAKE SURE YOU:  °· Understand these instructions. °· Will watch your condition. °· Will get help right away if you are not doing well or get worse. °Document Released: 10/06/2004 Document Revised: 10/17/2012 Document Reviewed: 08/08/2012 °[employer]® Patient Information ©2014 [employer]. ° °

## 2013-03-05 ENCOUNTER — Ambulatory Visit (INDEPENDENT_AMBULATORY_CARE_PROVIDER_SITE_OTHER): Payer: BC Managed Care – PPO | Admitting: Family Medicine

## 2013-03-05 ENCOUNTER — Encounter: Payer: Self-pay | Admitting: Internal Medicine

## 2013-03-05 ENCOUNTER — Encounter: Payer: BC Managed Care – PPO | Admitting: Internal Medicine

## 2013-03-05 DIAGNOSIS — R059 Cough, unspecified: Secondary | ICD-10-CM

## 2013-03-05 DIAGNOSIS — R05 Cough: Secondary | ICD-10-CM

## 2013-03-05 DIAGNOSIS — J329 Chronic sinusitis, unspecified: Secondary | ICD-10-CM

## 2013-03-05 MED ORDER — AZITHROMYCIN 250 MG PO TABS: ORAL_TABLET | ORAL | Status: DC

## 2013-03-05 NOTE — Progress Notes (Signed)
Document opened and reviewed for OV but appt  canceled same day . Seen by other provider  weather

## 2013-03-05 NOTE — Patient Instructions (Signed)
-  As we discussed, we have prescribed a new medication for you at this appointment. We discussed the common and serious potential adverse effects of this medication and you can review these and more with the pharmacist when you pick up your medication.  Please follow the instructions for use carefully and notify us immediately if you have any problems taking this medication.  -follow up if symptoms worsen or persist or you have new concerns

## 2013-03-05 NOTE — Progress Notes (Signed)
No chief complaint on file.   HPI:  -started: 2 weeks ago -symptoms:nasal congestion, sore throat, cough, sinus pain, sinus pressure, some nausea from coughing yesterday -denies:fever, SOB -has tried: musinex -sick contacts/travel/risks: denies flu exposure or Ebola risks  ROS: See pertinent positives and negatives per HPI.  Past Medical History  Diagnosis Date  . Migraine   . Skull fracture     no neurologic sequelae  . Torn Achilles tendon     right    Past Surgical History  Procedure Laterality Date  . Knee cartilage surgery      x2    Family History  Problem Relation Age of Onset  . Osteoporosis Mother   . Irritable bowel syndrome Mother   . Glaucoma Brother     History   Social History  . Marital Status: Married    Spouse Name: N/A    Number of Children: N/A  . Years of Education: N/A   Social History Main Topics  . Smoking status: Never Smoker   . Smokeless tobacco: Not on file  . Alcohol Use:   . Drug Use:   . Sexual Activity:    Other Topics Concern  . Not on file   Social History Narrative  . No narrative on file    Current outpatient prescriptions:aspirin 81 MG tablet, Take 81 mg by mouth daily.  , Disp: , Rfl: ;  azithromycin (ZITHROMAX) 250 MG tablet, 2 tabs on day one then 1 tab daily for 4 more days, Disp: 6 tablet, Rfl: 0;  cyclobenzaprine (FLEXERIL) 5 MG tablet, One p.o. nightly, Disp: 30 tablet, Rfl: 1;  fluticasone (FLONASE) 50 MCG/ACT nasal spray, Place 2 sprays into the nose daily.  , Disp: , Rfl:  meloxicam (MOBIC) 15 MG tablet, Take 1 tablet (15 mg total) by mouth daily., Disp: 30 tablet, Rfl: 0;  multivitamin (THERAGRAN) per tablet, Take 1 tablet by mouth daily.  , Disp: , Rfl: ;  traMADol (ULTRAM) 50 MG tablet, One p.o. nightly, Disp: 30 tablet, Rfl: 1;  zolmitriptan (ZOMIG-ZMT) 5 MG disintegrating tablet, TAKE 1 TABLET BY MOUTH AS NEEDED FOR MIGRAINE, Disp: 30 tablet, Rfl: 0  EXAM:  There were no vitals filed for this  visit.  There is no weight on file to calculate BMI.  GENERAL: vitals reviewed and listed above, alert, oriented, appears well hydrated and in no acute distress  HEENT: atraumatic, conjunttiva clear, no obvious abnormalities on inspection of external nose and ears, normal appearance of ear canals and TMs, clear nasal congestion, mild post oropharyngeal erythema with PND, no tonsillar edema or exudate, no sinus TTP  NECK: no obvious masses on inspection  LUNGS: course breath sound R middle lobe  CV: HRRR, no peripheral edema  MS: moves all extremities without noticeable abnormality  PSYCH: pleasant and cooperative, no obvious depression or anxiety  ASSESSMENT AND PLAN:  Discussed the following assessment and plan:  Sinusitis - Plan: azithromycin (ZITHROMAX) 250 MG tablet  Cough - Plan: azithromycin (ZITHROMAX) 250 MG tablet  -We discussed potential etiologies, with course breath sounds R middle lobe and tooth pain - discussed likely viral but potential for CAP, sinusitis. - We discussed treatment side effects, likely course, antibiotic misuse, transmission, and signs of developing a serious illness. -opted for tx with abx, return precuations -of course, we advised to return or notify a doctor immediately if symptoms worsen or persist or new concerns arise.    Patient Instructions  -As we discussed, we have prescribed a new medication for you  at this appointment. We discussed the common and serious potential adverse effects of this medication and you can review these and more with the pharmacist when you pick up your medication.  Please follow the instructions for use carefully and notify us immediately if you have any problems taking this medication.  -follow up if symptoms worsen or persist or you have new concerns     Xzavier Swinger R.

## 2013-03-11 ENCOUNTER — Ambulatory Visit (INDEPENDENT_AMBULATORY_CARE_PROVIDER_SITE_OTHER): Payer: BC Managed Care – PPO | Admitting: Family Medicine

## 2013-03-11 ENCOUNTER — Encounter: Payer: Self-pay | Admitting: Family Medicine

## 2013-03-11 VITALS — BP 102/80 | Temp 97.8°F | Wt 154.0 lb

## 2013-03-11 DIAGNOSIS — M542 Cervicalgia: Secondary | ICD-10-CM

## 2013-03-11 NOTE — Progress Notes (Signed)
Pre visit review using our clinic review tool, if applicable. No additional management support is needed unless otherwise documented below in the visit note. 

## 2013-03-11 NOTE — Progress Notes (Signed)
Chief Complaint  Patient presents with  . Neck Pain    HPI:  William Norris is a patient of Dr. Sherren Mocha, here for acute visit for neck pain: -started 3-4 days ago after running through airport with bags -in SCM muscle -seen in emergency room yesterday and CT scan done and blood test (told no infection, and CT scan normal) and treated with naproxen and flexeril which has helped -had a cold recently and is recovering from that but still has a sore throat at times -denies: fevers, vision changes, weakness, headache   ROS: See pertinent positives and negatives per HPI.  Past Medical History  Diagnosis Date  . Migraine   . Skull fracture     no neurologic sequelae  . Torn Achilles tendon     right    Past Surgical History  Procedure Laterality Date  . Knee cartilage surgery      x2    Family History  Problem Relation Age of Onset  . Osteoporosis Mother   . Irritable bowel syndrome Mother   . Glaucoma Brother     History   Social History  . Marital Status: Married    Spouse Name: N/A    Number of Children: N/A  . Years of Education: N/A   Social History Main Topics  . Smoking status: Never Smoker   . Smokeless tobacco: None  . Alcohol Use:   . Drug Use:   . Sexual Activity:    Other Topics Concern  . None   Social History Narrative  . None    Current outpatient prescriptions:aspirin 81 MG tablet, Take 81 mg by mouth daily.  , Disp: , Rfl: ;  cyclobenzaprine (FLEXERIL) 5 MG tablet, One p.o. nightly, Disp: 30 tablet, Rfl: 1;  fluticasone (FLONASE) 50 MCG/ACT nasal spray, Place 2 sprays into the nose daily.  , Disp: , Rfl: ;  meloxicam (MOBIC) 15 MG tablet, Take 1 tablet (15 mg total) by mouth daily., Disp: 30 tablet, Rfl: 0 multivitamin (THERAGRAN) per tablet, Take 1 tablet by mouth daily.  , Disp: , Rfl: ;  traMADol (ULTRAM) 50 MG tablet, One p.o. nightly, Disp: 30 tablet, Rfl: 1;  zolmitriptan (ZOMIG-ZMT) 5 MG disintegrating tablet, TAKE 1 TABLET BY MOUTH AS NEEDED  FOR MIGRAINE, Disp: 30 tablet, Rfl: 0  EXAM:  Filed Vitals:   03/11/13 1046  BP: 102/80  Temp: 97.8 F (36.6 C)    Body mass index is 24.11 kg/(m^2).  GENERAL: vitals reviewed and listed above, alert, oriented, appears well hydrated and in no acute distress  HEENT: atraumatic, conjunttiva clear, no obvious abnormalities on inspection of external nose and ears, normal appearance of ear canals and TMs, clear nasal congestion, mild post oropharyngeal erythema with PND, no tonsillar edema or exudate, no sinus TTP  NECK: no obvious masses on inspection, ROM mildly limited due to pain, TTP exactly along R SCM and not elsewhere, no carotid artery sensitivity or bruits, no bony TTP, neg spurling  MS: moves all extremities without noticeable abnormality  PSYCH: pleasant and cooperative, no obvious depression or anxiety  ASSESSMENT AND PLAN:  Discussed the following assessment and plan:  Neck pain  -we discussed possible serious and likely etiologies, workup and treatment, treatment risks and return precautions -after this discussion, William Norris opted for conservative tx of likely SCM muscle strain with HEP, heat, flexeril and naproxen -follow up advised as needed and emergency precautions discussed -of course, we advised William Norris  to return or notify a doctor immediately if symptoms  worsen or persist or new concerns arise.  -Patient advised to return or notify a doctor immediately if symptoms worsen or persist or new concerns arise.  Patient Instructions  -heat 15 minutes twice daily  -exercises provided  -medications as instructed as needed  -follow up as needed if persists or worsens     KIM, HANNAH R.

## 2013-03-11 NOTE — Patient Instructions (Signed)
-  heat 15 minutes twice daily  -exercises provided  -medications as instructed as needed  -follow up as needed if persists or worsens

## 2013-03-22 ENCOUNTER — Encounter: Payer: Self-pay | Admitting: Physician Assistant

## 2013-03-22 ENCOUNTER — Ambulatory Visit (INDEPENDENT_AMBULATORY_CARE_PROVIDER_SITE_OTHER)
Admission: RE | Admit: 2013-03-22 | Discharge: 2013-03-22 | Disposition: A | Discharge status: Home / Self Care | Payer: BC Managed Care – PPO | Source: Ambulatory Visit | Attending: Physician Assistant | Admitting: Physician Assistant

## 2013-03-22 ENCOUNTER — Ambulatory Visit (INDEPENDENT_AMBULATORY_CARE_PROVIDER_SITE_OTHER): Payer: BC Managed Care – PPO | Admitting: Physician Assistant

## 2013-03-22 VITALS — BP 116/80 | HR 70 | Temp 97.9°F | Wt 151.6 lb

## 2013-03-22 DIAGNOSIS — R059 Cough, unspecified: Secondary | ICD-10-CM

## 2013-03-22 DIAGNOSIS — J329 Chronic sinusitis, unspecified: Secondary | ICD-10-CM

## 2013-03-22 DIAGNOSIS — R05 Cough: Secondary | ICD-10-CM

## 2013-03-22 MED ORDER — HYDROCODONE-HOMATROPINE 5-1.5 MG/5ML PO SYRP: 5.0000 mL | ORAL_SOLUTION | Freq: Three times a day (TID) | ORAL | Status: DC | PRN

## 2013-03-22 MED ORDER — LEVOFLOXACIN 500 MG PO TABS: 500.0000 mg | ORAL_TABLET | Freq: Every day | ORAL | Status: DC

## 2013-03-22 NOTE — Patient Instructions (Addendum)
It was great meeting you today William Norris!  I have sent prescriptions to your pharmacy, please take as directed. I have ordered a chest xray, please report to the radiology department downstairs.  Cough, Adult  A cough is a reflex. It helps you clear your throat and airways. A cough can help heal your body. A cough can last 2 or 3 weeks (acute) or may last more than 8 weeks (chronic). Some common causes of a cough can include an infection, allergy, or a cold. HOME CARE  Only take medicine as told by your doctor.  If given, take your medicines (antibiotics) as told. Finish them even if you start to feel better.  Use a cold steam vaporizer or humidier in your home. This can help loosen thick spit (secretions).  Sleep so you are almost sitting up (semi-upright). Use pillows to do this. This helps reduce coughing.  Rest as needed.  Stop smoking if you smoke. GET HELP RIGHT AWAY IF:  You have yellowish-white fluid (pus) in your thick spit.  Your cough gets worse.  Your medicine does not reduce coughing, and you are losing sleep.  You cough up blood.  You have trouble breathing.  Your pain gets worse and medicine does not help.  You have a fever. MAKE SURE YOU:   Understand these instructions.  Will watch your condition.  Will get help right away if you are not doing well or get worse. Document Released: 09/09/2010 Document Revised: 03/21/2011 Document Reviewed: 09/09/2010 [facility] Patient Information 2014 [place].  Sinusitis Sinusitis is redness, soreness, and puffiness (inflammation) of the air pockets in the bones of your face (sinuses). The redness, soreness, and puffiness can cause air and mucus to get trapped in your sinuses. This can allow germs to grow and cause an infection.  HOME CARE   Drink enough fluids to keep your pee (urine) clear or pale yellow.  Use a humidifier in your home.  Run a hot shower to create steam in the bathroom. Sit in the bathroom  with the door closed. Breathe in the steam 3 4 times a day.  Put a warm, moist washcloth on your face 3 4 times a day, or as told by your doctor.  Use salt water sprays (saline sprays) to wet the thick fluid in your nose. This can help the sinuses drain.  Only take medicine as told by your doctor. GET HELP RIGHT AWAY IF:   Your pain gets worse.  You have very bad headaches.  You are sick to your stomach (nauseous).  You throw up (vomit).  You are very sleepy (drowsy) all the time.  Your face is puffy (swollen).  Your vision changes.  You have a stiff neck.  You have trouble breathing. MAKE SURE YOU:   Understand these instructions.  Will watch your condition.  Will get help right away if you are not doing well or get worse. Document Released: 06/15/2007 Document Revised: 09/21/2011 Document Reviewed: 08/02/2011 [facility] Patient Information 2014 [place].

## 2013-03-22 NOTE — Progress Notes (Signed)
Pre visit review using our clinic review tool, if applicable. No additional management support is needed unless otherwise documented below in the visit note. 

## 2013-03-22 NOTE — Progress Notes (Signed)
    Patient ID: William Norris is a 58 y.o. male DOB: 1955/01/13 MRN: [id number]  Subjective:    HPI  complains of head cold symptoms, ?sinusitus Onset 4 week ago, initially improved then relapsing and worse symptoms  First associated with rhinorrhea, sneezing, sore throat, mild headache and low grade fever  Now sinus pressure and mild-mod nasal congestion, yellow-green discharge, upper dental pain occasionally  Has been evaluated in past and treated with short courses of antibiotics. Has not had chest xray Precipitated by sick contacts and weather change  Past Medical History  Diagnosis Date  . Migraine   . Skull fracture     no neurologic sequelae  . Torn Achilles tendon     right    Review of Systems Constitutional: No fever or chills, no unexpected weight change Pulmonary: No wheezing or hemoptysis, no SOB Cardiovascular: No chest pain or palpitations Abdomen: No N/V, diarrhea or constipation Musc: No myalgias    Objective:   Physical Exam  BP 116/80  Pulse 70  Temp(Src) 97.9 F (36.6 C) (Oral)  Wt 151 lb 9.6 oz (68.765 kg)  SpO2 96%  GEN: mildly ill appearing and audible head/chest congestion HENT: NCAT, mild sinus tenderness bilaterally R>L, right nares with mild discharge and turbinate swelling, oropharynx mild erythema and PND, no exudate Eyes: Vision grossly intact, no conjunctivitis Lungs: Clear to auscultation without rhonchi or wheeze, no increased work of breathing Cardiovascular: Regular rate and rhythm, no bilateral edema  Lab Results  Component Value Date   WBC 5.0 01/29/2009   HGB 14.4 01/29/2009   HCT 43.8 01/29/2009   PLT 178.0 01/29/2009   GLUCOSE 110* 01/29/2009   CHOL 218* 01/29/2009   TRIG 67.0 01/29/2009   HDL 48.40 01/29/2009   LDLDIRECT 165.5 01/29/2009   LDLCALC 117* 02/22/2006   ALT 18 01/29/2009   AST 27 01/29/2009   NA 141 01/29/2009   K 4.3 01/29/2009   CL 105 01/29/2009   CREATININE 0.9 01/29/2009   BUN 16 01/29/2009   CO2 30  01/29/2009   TSH 1.16 01/29/2009   PSA 0.33 01/29/2009   HGBA1C 5.9 02/22/2006      Assessment & Plan:  Viral URI > progression to sinusitis Cough, postnasal drip related to above   Empiric antibiotics prescribed due to symptom duration greater than 7 days  Prescription cough suppression - new prescriptions done Symptomatic care with Tylenol or Advil, hydration and rest -  Saline irrigation and salt gargle advised as needed CXR today. Consider possible CT of sinus.

## 2013-03-23 ENCOUNTER — Encounter: Payer: Self-pay | Admitting: Physician Assistant

## 2013-10-07 ENCOUNTER — Other Ambulatory Visit: Payer: Self-pay | Admitting: Family Medicine

## 2014-03-23 ENCOUNTER — Other Ambulatory Visit: Payer: Self-pay | Admitting: Family Medicine

## 2014-06-02 ENCOUNTER — Encounter: Payer: Self-pay | Admitting: Gastroenterology

## 2014-07-04 ENCOUNTER — Telehealth: Payer: Self-pay | Admitting: Family Medicine

## 2014-07-04 NOTE — Telephone Encounter (Signed)
thatd be fine. Wait until next opening or if there is a cancellation in a Monday slot please.

## 2014-07-04 NOTE — Telephone Encounter (Signed)
This pt is a pt of Dr Sherren Mocha and would like to transfer to you.Will this be ok?

## 2014-07-04 NOTE — Telephone Encounter (Signed)
See below

## 2014-07-07 NOTE — Telephone Encounter (Signed)
lmovm to return call  °

## 2014-07-07 NOTE — Telephone Encounter (Signed)
Pt is aware of dr hunter next avail appt and will think about it and callback if needed

## 2014-08-25 ENCOUNTER — Other Ambulatory Visit: Payer: Self-pay | Admitting: Family Medicine

## 2014-10-15 ENCOUNTER — Encounter: Payer: Self-pay | Admitting: Family Medicine

## 2014-10-15 ENCOUNTER — Ambulatory Visit (INDEPENDENT_AMBULATORY_CARE_PROVIDER_SITE_OTHER): Payer: BLUE CROSS/BLUE SHIELD | Admitting: Family Medicine

## 2014-10-15 VITALS — BP 120/80 | Temp 98.3°F | Wt 157.0 lb

## 2014-10-15 DIAGNOSIS — Z23 Encounter for immunization: Secondary | ICD-10-CM

## 2014-10-15 DIAGNOSIS — G4720 Circadian rhythm sleep disorder, unspecified type: Secondary | ICD-10-CM | POA: Diagnosis not present

## 2014-10-15 MED ORDER — TRAZODONE HCL 50 MG PO TABS: ORAL_TABLET | ORAL | Status: DC

## 2014-10-15 MED ORDER — LORAZEPAM 0.5 MG PO TABS: ORAL_TABLET | ORAL | Status: DC

## 2014-10-15 NOTE — Progress Notes (Signed)
Pre visit review using our clinic review tool, if applicable. No additional management support is needed unless otherwise documented below in the visit note. 

## 2014-10-15 NOTE — Patient Instructions (Signed)
Trazodone 50 mg.......Marland Kitchen 1 tablet at bedtime  Ativan 0.5.......Marland Kitchen 1 at bedtime for 3-4 weeks and then stop  Complete caffeine free diet  Exercise in the morning  Follow-up in one month

## 2014-10-15 NOTE — Progress Notes (Signed)
   Subjective:    Patient ID: William Norris, male    DOB: [birth year], 59 y.o.   MRN: [id number]  HPI William Norris is a 59 year old married male nonsmoker who comes in today for evaluation of insomnia  He's always managed his insomnia with over-the-counter medications. Most recently they do not work. He's had a lot of job stress. He goes to sleep okay but he wakes up in the middle night and can go back to sleep.  He drinks coffee in the morning and some caffeine at noon. He also begin exercising again but he starting his running program at 7:00 in the evening.   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 caffeine free diet....... exercise in the morning not at night....... trazodone 50 mg at bedtime...Marland KitchenMarland KitchenMarland Kitchen Ativan 0.5 for 4-6 weeks until the trazodone

## 2014-10-17 ENCOUNTER — Telehealth: Payer: Self-pay | Admitting: Family Medicine

## 2014-10-17 DIAGNOSIS — Z1211 Encounter for screening for malignant neoplasm of colon: Secondary | ICD-10-CM

## 2014-10-17 NOTE — Telephone Encounter (Signed)
Pt said he contacted [facility] per Dr Sherren Mocha request and they told him he needed a referral from his PCP.

## 2014-10-17 NOTE — Telephone Encounter (Signed)
Referral placed.

## 2014-10-20 ENCOUNTER — Encounter: Payer: Self-pay | Admitting: Gastroenterology

## 2014-11-19 ENCOUNTER — Encounter: Payer: Self-pay | Admitting: Family Medicine

## 2014-11-19 ENCOUNTER — Ambulatory Visit (INDEPENDENT_AMBULATORY_CARE_PROVIDER_SITE_OTHER): Payer: BLUE CROSS/BLUE SHIELD | Admitting: Family Medicine

## 2014-11-19 VITALS — BP 110/80 | Temp 98.1°F | Wt 157.0 lb

## 2014-11-19 DIAGNOSIS — G4720 Circadian rhythm sleep disorder, unspecified type: Secondary | ICD-10-CM

## 2014-11-19 MED ORDER — TRAZODONE HCL 100 MG PO TABS: 100.0000 mg | ORAL_TABLET | Freq: Every day | ORAL | Status: DC

## 2014-11-19 MED ORDER — LORAZEPAM 0.5 MG PO TABS: ORAL_TABLET | ORAL | Status: DC

## 2014-11-19 NOTE — Progress Notes (Signed)
Pre visit review using our clinic review tool, if applicable. No additional management support is needed unless otherwise documented below in the visit note. 

## 2014-11-19 NOTE — Patient Instructions (Signed)
Trazodone 100 mg..........Marland Kitchen 1 at bedtime  Ativan 0.5......Marland Kitchen 1 at bedtime when necessary  Call in 4 weeks......... leave a voicemail with Apolonio Schneiders extension [phone] and give [place] a progress report if you need to increase your medication  If this program does not work or you would like a second opinion I would recommend somebody Dr. Tomasita C[facility]

## 2014-11-19 NOTE — Progress Notes (Signed)
   Subjective:    Patient ID: William Norris, male    DOB: 1955/11/21, 59 y.o.   MRN: [id number]  HPI William Norris is a 59 year old married male nonsmoker who comes in today for reevaluation sleep dysfunction  We saw him about 5 weeks ago with a complaint of sleep dysfunction. He wake up 3:00 morning can go back to sleep. We started on trazodone 50 mg and a short comes of Ativan 0.5 to help him get some rest at night. He took the Ativan for about 3 weeks and stopped it. On the 50 mg of trazodone he still not sleeping well.  We discussed various options including increasing the trazodone or seeing somebody at Dr. Arvil Norris group. He would like to increase the trazodone first   Review of Systems    review of systems otherwise negative Objective:   Physical Exam Well-developed well-nourished male no acute distress vital signs stable he is afebrile       Assessment & Plan:  Sleep dysfunction............... increase trazodone by 25 mg every 4 weeks until we reach maxed dose or the medication works. If it dose any improvement then we'll have GI see one of the folks at William Norris office.Marland KitchenMarland KitchenMarland Kitchen

## 2014-12-03 ENCOUNTER — Other Ambulatory Visit: Payer: Self-pay | Admitting: Family Medicine

## 2014-12-17 ENCOUNTER — Ambulatory Visit: Payer: BLUE CROSS/BLUE SHIELD | Admitting: Gastroenterology

## 2015-02-05 ENCOUNTER — Encounter: Payer: Self-pay | Admitting: Gastroenterology

## 2015-02-05 ENCOUNTER — Ambulatory Visit (INDEPENDENT_AMBULATORY_CARE_PROVIDER_SITE_OTHER): Payer: Managed Care, Other (non HMO) | Admitting: Gastroenterology

## 2015-02-05 VITALS — BP 102/70 | HR 64 | Ht 67.25 in | Wt 152.0 lb

## 2015-02-05 DIAGNOSIS — K649 Unspecified hemorrhoids: Secondary | ICD-10-CM | POA: Diagnosis not present

## 2015-02-05 DIAGNOSIS — R159 Full incontinence of feces: Secondary | ICD-10-CM

## 2015-02-05 NOTE — Progress Notes (Signed)
HPI :  60 y/o male, here for new patient evaluation for symptoms of fecal leakage.   Patient reports after each bowel movement, he does not think he can get himself clean enough due to leakage of stool. He reports this has been ongoing for a year or so. He has on average one BM per day. He reports soft stools, but formed. No blood in the stools. No pain in the anal area. He reports spending about 15 minutes in the bathroom having a BM. He has some straining. He thinks he may have some swelling of his anal canal after a BM, not sure if this is hemorrhoids. No blood in the stools at all. He reports he can use an entire roll of toilet of paper to clean himself. He uses a washcloth and clean with water after having a BM. He reports having had leakage of stool after he has wiped himself, can last up to 1-2 hours before it stops. If he washes himself the leakage will not occur. Outside of having BMs, he will not have leakage. No abdominal pains. No FH of colon cancer. He has tried a lactose free diet which has not helped at all. No perianal rash. He has some pruritus if he wipes himself. He denies any urinary problems or sensory impairment in the anal area.  Colonoscopy 10/2007 - diverticulosis, otherwise normal without polyps   Past Medical History  Diagnosis Date  . Migraine   . Skull fracture ([place])     no neurologic sequelae  . Torn Achilles tendon     right  . Insomnia     Past Surgical History  Procedure Laterality Date  . Knee cartilage surgery      x2   Family History  Problem Relation Age of Onset  . Osteoporosis Mother   . Irritable bowel syndrome Mother   . Glaucoma Brother    Social History  Substance Use Topics  . Smoking status: Never Smoker   . Smokeless tobacco: Never Used  . Alcohol Use: 0.0 oz/week    0 Standard drinks or equivalent per week   Current Outpatient Prescriptions  Medication Sig Dispense Refill  . aspirin 81 MG tablet Take 81 mg by mouth daily.        . multivitamin (THERAGRAN) per tablet Take 1 tablet by mouth daily.      . traZODone (DESYREL) 100 MG tablet Take 1 tablet (100 mg total) by mouth at bedtime. 100 tablet 3  . zolmitriptan (ZOMIG-ZMT) 5 MG disintegrating tablet TAKE 1 TABLET BY MOUTH AS DIRECTED FOR MIGRAINE 15 tablet 0   No current facility-administered medications for this visit.   No Known Allergies   Review of Systems: All systems reviewed and negative except where noted in HPI.    Lab Results  Component Value Date   WBC 5.0 01/29/2009   HGB 14.4 01/29/2009   HCT 43.8 01/29/2009   MCV 90.4 01/29/2009   PLT 178.0 01/29/2009      Physical Exam: BP 102/70 mmHg  Pulse 64  Ht 5' 7.25" (1.708 m)  Wt 152 lb (68.947 kg)  BMI 23.63 kg/m2 Constitutional: Pleasant,well-developed, male in no acute distress. HEENT: Normocephalic and atraumatic. Conjunctivae are normal. No scleral icterus. Neck supple.  Cardiovascular: Normal rate, regular rhythm.  Pulmonary/chest: Effort normal and breath sounds normal. No wheezing, rales or rhonchi. Abdominal: Soft, nondistended, nontender. Bowel sounds active throughout. There are no masses palpable. No hepatomegaly.  Anoscopy: hemorrhoid in RP region, external skin tag. No  mass lesions. Normal anal tone. No fissure appreciated.  Extremities: no edema Lymphadenopathy: No cervical adenopathy noted. Neurological: Alert and oriented to person place and time. Skin: Skin is warm and dry. No rashes noted. Psychiatric: Normal mood and affect. Behavior is normal.   ASSESSMENT AND PLAN:  60 y/o male with fecal leakage as outlined above. Anoscopy performed today and he does have internal hemorrhoids appreciated as outlined above. I consented him for banding procedure as banding can help symptoms of fecal leakage in patients with hemorrhoids. Following a discussion of the risks / benefits of banding he wanted to proceed. The [place] system applicator was used and when suction was applied to  the RP area, he complained of rectal band thus the band was not fired. I released suction and applied it again in the same region, again he had pain and the band was not fired. I applied suction twice to the RP area and both times he had pain and the band was not fired. Unclear why he was so sensitive to the applicator but given his discomfort with this, I don't think he would tolerate a banding well. I discussed other options at this time. While his last colonoscopy was in 2009 these symptoms are fairly recent, I offered him a flex sig in light of his discomfort with banding attempt today to ensure normal and no other pathology to cause this. If hemorrhoids are noted and no other pathology, we may try a banding again. If he does not tolerate it, I will refer him for anorectal manometry to ensure this is normal in light of his symptoms. He agreed with the plan. We will perform the flex sig without sedation to have the opportunity to place a band if needed at the time. I explained the risks of endoscopy with him and he wished to proceed. Further recommendations pending this exam.   Mon[place]zuma Cellar, MD [facility] Pager [phone]  CC: Dorena Cookey, MD

## 2015-02-05 NOTE — Patient Instructions (Signed)
You have been scheduled for a flexible sigmoidoscopy. Please follow the written instructions given to you at your visit today. If you use inhalers (even only as needed), please bring them with you on the day of your procedure.  

## 2015-02-12 ENCOUNTER — Other Ambulatory Visit: Payer: Self-pay

## 2015-02-12 DIAGNOSIS — R159 Full incontinence of feces: Secondary | ICD-10-CM

## 2015-02-18 ENCOUNTER — Ambulatory Visit (AMBULATORY_SURGERY_CENTER): Payer: Managed Care, Other (non HMO) | Admitting: Gastroenterology

## 2015-02-18 ENCOUNTER — Encounter: Payer: Self-pay | Admitting: Gastroenterology

## 2015-02-18 ENCOUNTER — Telehealth: Payer: Self-pay | Admitting: *Deleted

## 2015-02-18 VITALS — BP 120/83 | HR 67 | Temp 98.4°F | Resp 16 | Ht 67.0 in | Wt 152.0 lb

## 2015-02-18 DIAGNOSIS — R159 Full incontinence of feces: Secondary | ICD-10-CM

## 2015-02-18 DIAGNOSIS — K649 Unspecified hemorrhoids: Secondary | ICD-10-CM

## 2015-02-18 MED ORDER — SODIUM CHLORIDE 0.9 % IV SOLN: 500.0000 mL | INTRAVENOUS | Status: DC

## 2015-02-18 NOTE — Telephone Encounter (Signed)
-----   Message from Manus Gunning, MD sent at 02/18/2015 12:30 PM EST ----- Regarding: banding Hi Jamyrah Saur, I had hoped this patient could do a banding on the 14th but he cannot. Can you give him a call to schedule in the upcoming weeks? He should do this after he returns from his vacation. Thanks

## 2015-02-18 NOTE — Op Note (Signed)
[place]  [address] [place], [id number]   FLEXIBLE SIGMOIDOSCOPY PROCEDURE REPORT  PATIENT: William Norris, William Norris  MR#: [id number] BIRTHDATE: 1955-07-29 , 35  yrs. old GENDER: male ENDOSCOPIST: Yetta Flock, MD REFERRED BY: PROCEDURE DATE:  02/18/2015 PROCEDURE:   Sigmoidoscopy, diagnostic ASA CLASS:   Class II INDICATIONS:fecal leakage, suspect hemorrhoids. MEDICATIONS: no sedation  DESCRIPTION OF PROCEDURE:   After the risks benefits and alternatives of the procedure were thoroughly explained, informed consent was obtained.  Digital exam revealed a skin tag. The LB [id number]-Q180 [id number]  endoscope was introduced through the anus  and advanced to the sigmoid colon , The exam was Without limitations. The quality of the prep was The overall prep quality was adequate. . Estimated blood loss is zero unless otherwise noted in this procedure report. The instrument was then slowly withdrawn as the mucosa was fully examined.       COLON FINDINGS: The colonic mucosa was normal from the rectum through the sigmoid colon.  No polyps or mass lesions.  No inflammatory lesions.  Internal hemorrhoids and hypertrophied anal papillae were noted on retroflexion.          The scope was then withdrawn from the patient and the procedure terminated.  COMPLICATIONS: There were no immediate complications.  ENDOSCOPIC IMPRESSION: Internal hemorrhoids with hypertrophied anal papillae, no other pathology appreciated  RECOMMENDATIONS: Return to clinic on Feb 14th in the PM for banding attempt of hemorrhoids which may help symptoms. You will be contacted with time for procedure Resume diet Resume medications   eSigned:  Yetta Flock, MD 02/18/2015 11:44 AM   CC: the patient

## 2015-02-18 NOTE — Telephone Encounter (Signed)
Left a message for patient to call back. 

## 2015-02-18 NOTE — Patient Instructions (Signed)
YOU HAD AN ENDOSCOPIC PROCEDURE TODAY AT [facility]:   Refer to the procedure report that was given to you for any specific questions about what was found during the examination.  If the procedure report does not answer your questions, please call your gastroenterologist to clarify.  If you requested that your care partner not be given the details of your procedure findings, then the procedure report has been included in a sealed envelope for you to review at your convenience later.  YOU SHOULD EXPECT: Some feelings of bloating in the abdomen. Passage of more gas than usual.  Walking can help get rid of the air that was put into your GI tract during the procedure and reduce the bloating. If you had a lower endoscopy (such as a colonoscopy or flexible sigmoidoscopy) you may notice spotting of blood in your stool or on the toilet paper. If you underwent a bowel prep for your procedure, you may not have a normal bowel movement for a few days.  Please Note:  You might notice some irritation and congestion in your nose or some drainage.  This is from the oxygen used during your procedure.  There is no need for concern and it should clear up in a day or so.  SYMPTOMS TO REPORT IMMEDIATELY:   Following lower endoscopy (colonoscopy or flexible sigmoidoscopy):  Excessive amounts of blood in the stool  Significant tenderness or worsening of abdominal pains  Swelling of the abdomen that is new, acute  Fever of 100F or higher   For urgent or emergent issues, a gastroenterologist can be reached at any hour by calling [phone].   DIET: Your first meal following the procedure should be a small meal and then it is ok to progress to your normal diet. Heavy or fried foods are harder to digest and may make you feel nauseous or bloated.  Likewise, meals heavy in dairy and vegetables can increase bloating.  Drink plenty of fluids but you should avoid alcoholic beverages for 24  hours.  ACTIVITY:  You should plan to take it easy for the rest of today and you should NOT DRIVE or use heavy machinery until tomorrow (because of the sedation medicines used during the test).    FOLLOW UP: Our staff will call the number listed on your records the next business day following your procedure to check on you and address any questions or concerns that you may have regarding the information given to you following your procedure. If we do not reach you, we will leave a message.  However, if you are feeling well and you are not experiencing any problems, there is no need to return our call.  We will assume that you have returned to your regular daily activities without incident.  If any biopsies were taken you will be contacted by phone or by letter within the next 1-3 weeks.  Please call us at [phone] if you have not heard about the biopsies in 3 weeks.    SIGNATURES/CONFIDENTIALITY: You and/or your care partner have signed paperwork which will be entered into your electronic medical record.  These signatures attest to the fact that that the information above on your After Visit Summary has been reviewed and is understood.  Full responsibility of the confidentiality of this discharge information lies with you and/or your care-partner.  Return to clinic for hemorrhoid banding in Dr. Havery Moros office Resume diet and medication

## 2015-02-18 NOTE — Progress Notes (Signed)
Report to PACU, RN, vss, BBS= Clear.  

## 2015-02-19 ENCOUNTER — Telehealth: Payer: Self-pay

## 2015-02-19 NOTE — Telephone Encounter (Signed)
Left a message for patient to call to schedule banding.

## 2015-02-19 NOTE — Telephone Encounter (Signed)
  Follow up Call-  Call back number 02/18/2015  Post procedure Call Back phone  # [phone]  Permission to leave phone message Yes     Patient questions:  Do you have a fever, pain , or abdominal swelling? No. Pain Score  0 *  Have you tolerated food without any problems? Yes.    Have you been able to return to your normal activities? Yes.    Do you have any questions about your discharge instructions: Diet   No. Medications  No. Follow up visit  No.  Do you have questions or concerns about your Care? No.  Actions: * If pain score is 4 or above: No action needed, pain <4.

## 2015-02-20 NOTE — Telephone Encounter (Signed)
Spoke with patient and scheduled banding on 03/27/15 at 8:45 AM.

## 2015-02-20 NOTE — Telephone Encounter (Signed)
Left a message for patient to call back. 

## 2015-02-24 ENCOUNTER — Encounter: Payer: Managed Care, Other (non HMO) | Admitting: Gastroenterology

## 2015-02-26 ENCOUNTER — Other Ambulatory Visit: Payer: Self-pay | Admitting: Family Medicine

## 2015-03-27 ENCOUNTER — Encounter: Payer: Self-pay | Admitting: Gastroenterology

## 2015-03-27 ENCOUNTER — Ambulatory Visit (INDEPENDENT_AMBULATORY_CARE_PROVIDER_SITE_OTHER): Payer: Managed Care, Other (non HMO) | Admitting: Gastroenterology

## 2015-03-27 VITALS — BP 100/70 | HR 84 | Ht 67.0 in | Wt 151.0 lb

## 2015-03-27 DIAGNOSIS — K640 First degree hemorrhoids: Secondary | ICD-10-CM | POA: Diagnosis not present

## 2015-03-27 NOTE — Progress Notes (Signed)
PROCEDURE NOTE: The patient presents with symptomatic grade I  hemorrhoids, requesting rubber band ligation of his/her hemorrhoidal disease.  All risks, benefits and alternative forms of therapy were described and informed consent was obtained.   The anorectum was pre-medicated with 0.125% nitroglycerin The decision was made to band the LL internal hemorrhoid, and the [place] was used to perform band ligation without complication.  Digital anorectal examination was then performed to assure proper positioning of the band, and to adjust the banded tissue as required.  The patient was discharged home without pain or other issues.  Dietary and behavioral recommendations were given and along with follow-up instructions.     The following adjunctive treatments were recommended: Daily fiber supplement  The patient will return in 2-3 weeks for  follow-up and possible additional banding as required. No complications were encountered and the patient tolerated the procedure well.  Ridgely Cellar, MD [facility] Pager [phone]

## 2015-03-27 NOTE — Patient Instructions (Signed)
Your 2nd hemorrhoid banding is scheduled for 04/14/15 at 4:00pm.   HEMORRHOID BANDING PROCEDURE    FOLLOW-UP CARE   1. The procedure you have had should have been relatively painless since the banding of the area involved does not have nerve endings and there is no pain sensation.  The rubber band cuts off the blood supply to the hemorrhoid and the band may fall off as soon as 48 hours after the banding (the band may occasionally be seen in the toilet bowl following a bowel movement). You may notice a temporary feeling of fullness in the rectum which should respond adequately to plain Tylenol or Motrin.  2. Following the banding, avoid strenuous exercise that evening and resume full activity the next day.  A sitz bath (soaking in a warm tub) or bidet is soothing, and can be useful for cleansing the area after bowel movements.     3. To avoid constipation, take two tablespoons of natural wheat bran, natural oat bran, flax, Benefiber or any over the counter fiber supplement and increase your water intake to 7-8 glasses daily.    4. Unless you have been prescribed anorectal medication, do not put anything inside your rectum for two weeks: No suppositories, enemas, fingers, etc.  5. Occasionally, you may have more bleeding than usual after the banding procedure.  This is often from the untreated hemorrhoids rather than the treated one.  Don't be concerned if there is a tablespoon or so of blood.  If there is more blood than this, lie flat with your bottom higher than your head and apply an ice pack to the area. If the bleeding does not stop within a half an hour or if you feel faint, call our office at [phone] or go to the emergency room.  6. Problems are not common; however, if there is a substantial amount of bleeding, severe pain, chills, fever or difficulty passing urine (very rare) or other problems, you should call us at [phone] or report to the nearest emergency room.  7. Do  not stay seated continuously for more than 2-3 hours for a day or two after the procedure.  Tighten your buttock muscles 10-15 times every two hours and take 10-15 deep breaths every 1-2 hours.  Do not spend more than a few minutes on the toilet if you cannot empty your bowel; instead re-visit the toilet at a later time.

## 2015-04-10 ENCOUNTER — Encounter: Payer: Self-pay | Admitting: *Deleted

## 2015-04-14 ENCOUNTER — Encounter: Payer: Self-pay | Admitting: Gastroenterology

## 2015-04-14 ENCOUNTER — Ambulatory Visit (INDEPENDENT_AMBULATORY_CARE_PROVIDER_SITE_OTHER): Payer: Managed Care, Other (non HMO) | Admitting: Gastroenterology

## 2015-04-14 VITALS — BP 124/78 | HR 70 | Wt 152.5 lb

## 2015-04-14 DIAGNOSIS — K648 Other hemorrhoids: Secondary | ICD-10-CM

## 2015-04-14 NOTE — Patient Instructions (Addendum)
You have been scheduled for your 3rd hemorrhoidal banding on Friday, 05/22/15 @ 4:00 pm.  If you are age 59 or older, your body mass index should be between 23-30. Your Body mass index is 23.88 kg/(m^2). If this is out of the aforementioned range listed, please consider follow up with your Primary Care Provider.  If you are age 107 or younger, your body mass index should be between 19-25. Your Body mass index is 23.88 kg/(m^2). If this is out of the aformentioned range listed, please consider follow up with your Primary Care Provider.    HEMORRHOID BANDING PROCEDURE    FOLLOW-UP CARE   1. The procedure you have had should have been relatively painless since the banding of the area involved does not have nerve endings and there is no pain sensation.  The rubber band cuts off the blood supply to the hemorrhoid and the band may fall off as soon as 48 hours after the banding (the band may occasionally be seen in the toilet bowl following a bowel movement). You may notice a temporary feeling of fullness in the rectum which should respond adequately to plain Tylenol or Motrin.  2. Following the banding, avoid strenuous exercise that evening and resume full activity the next day.  A sitz bath (soaking in a warm tub) or bidet is soothing, and can be useful for cleansing the area after bowel movements.     3. To avoid constipation, take two tablespoons of natural wheat bran, natural oat bran, flax, Benefiber or any over the counter fiber supplement and increase your water intake to 7-8 glasses daily.    4. Unless you have been prescribed anorectal medication, do not put anything inside your rectum for two weeks: No suppositories, enemas, fingers, etc.  5. Occasionally, you may have more bleeding than usual after the banding procedure.  This is often from the untreated hemorrhoids rather than the treated one.  Don't be concerned if there is a tablespoon or so of blood.  If there is more blood than this,  lie flat with your bottom higher than your head and apply an ice pack to the area. If the bleeding does not stop within a half an hour or if you feel faint, call our office at [phone] or go to the emergency room.  6. Problems are not common; however, if there is a substantial amount of bleeding, severe pain, chills, fever or difficulty passing urine (very rare) or other problems, you should call us at [phone] or report to the nearest emergency room.  7. Do not stay seated continuously for more than 2-3 hours for a day or two after the procedure.  Tighten your buttock muscles 10-15 times every two hours and take 10-15 deep breaths every 1-2 hours.  Do not spend more than a few minutes on the toilet if you cannot empty your bowel; instead re-visit the toilet at a later time.

## 2015-04-14 NOTE — Progress Notes (Signed)
PROCEDURE NOTE: The patient presents with symptomatic grade I  hemorrhoids, requesting rubber band ligation of his/her hemorrhoidal disease.  All risks, benefits and alternative forms of therapy were described and informed consent was obtained.   The anorectum was pre-medicated with 0.125% nitroglycerin The decision was made to band the RP internal hemorrhoid, and the [place] was used to perform band ligation without complication.  Digital anorectal examination was then performed to assure proper positioning of the band, and to adjust the banded tissue as required.  The patient was discharged home without pain or other issues.  Dietary and behavioral recommendations were given and along with follow-up instructions.     The following adjunctive treatments were recommended: Daily fiber supplement  The patient will return 2-4 weeks for  follow-up and possible additional banding as required. No complications were encountered and the patient tolerated the procedure well.  Riddle Cellar, MD [facility] Pager [phone]

## 2015-04-26 IMAGING — CR DG CHEST 2V
2 series · 2 of 2 positions shown · non-contrast
Comparison: None.

CLINICAL DATA: Cough and congestion.

EXAM:
CHEST  2 VIEW

[view not recorded (1 of 2)]
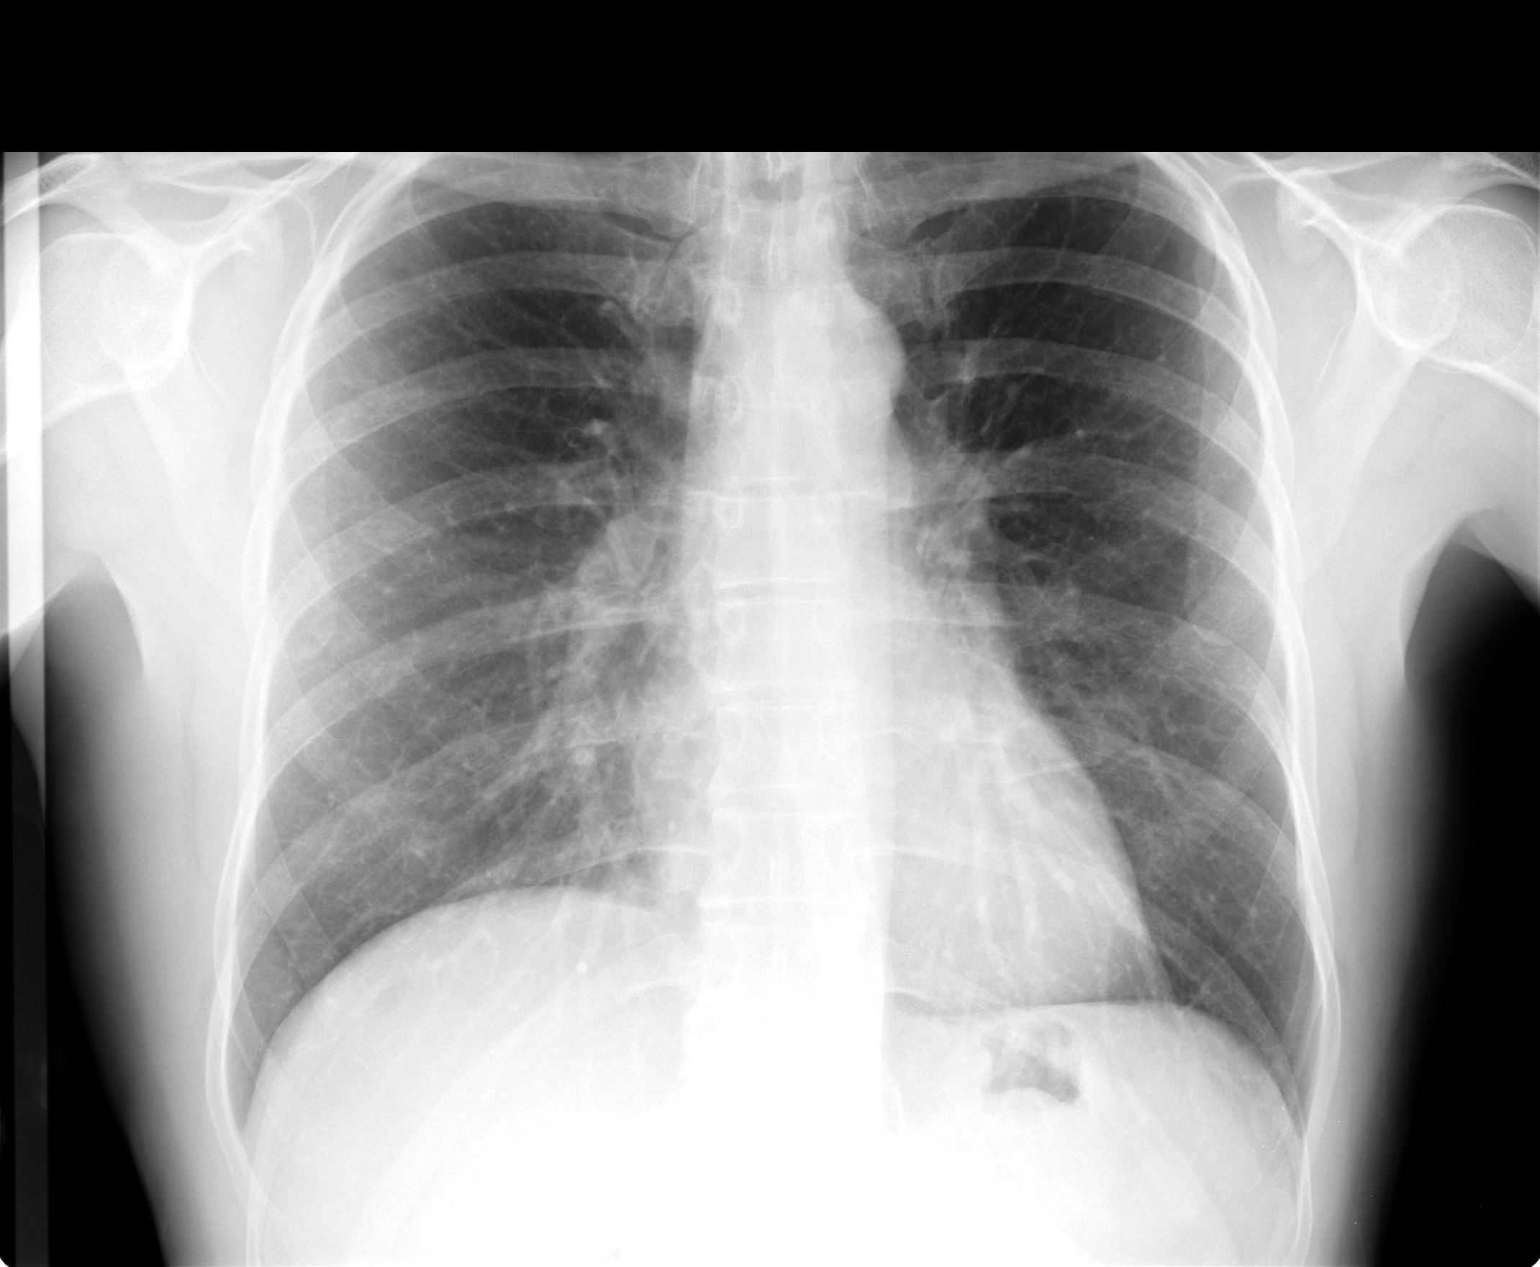

[view not recorded (2 of 2)]
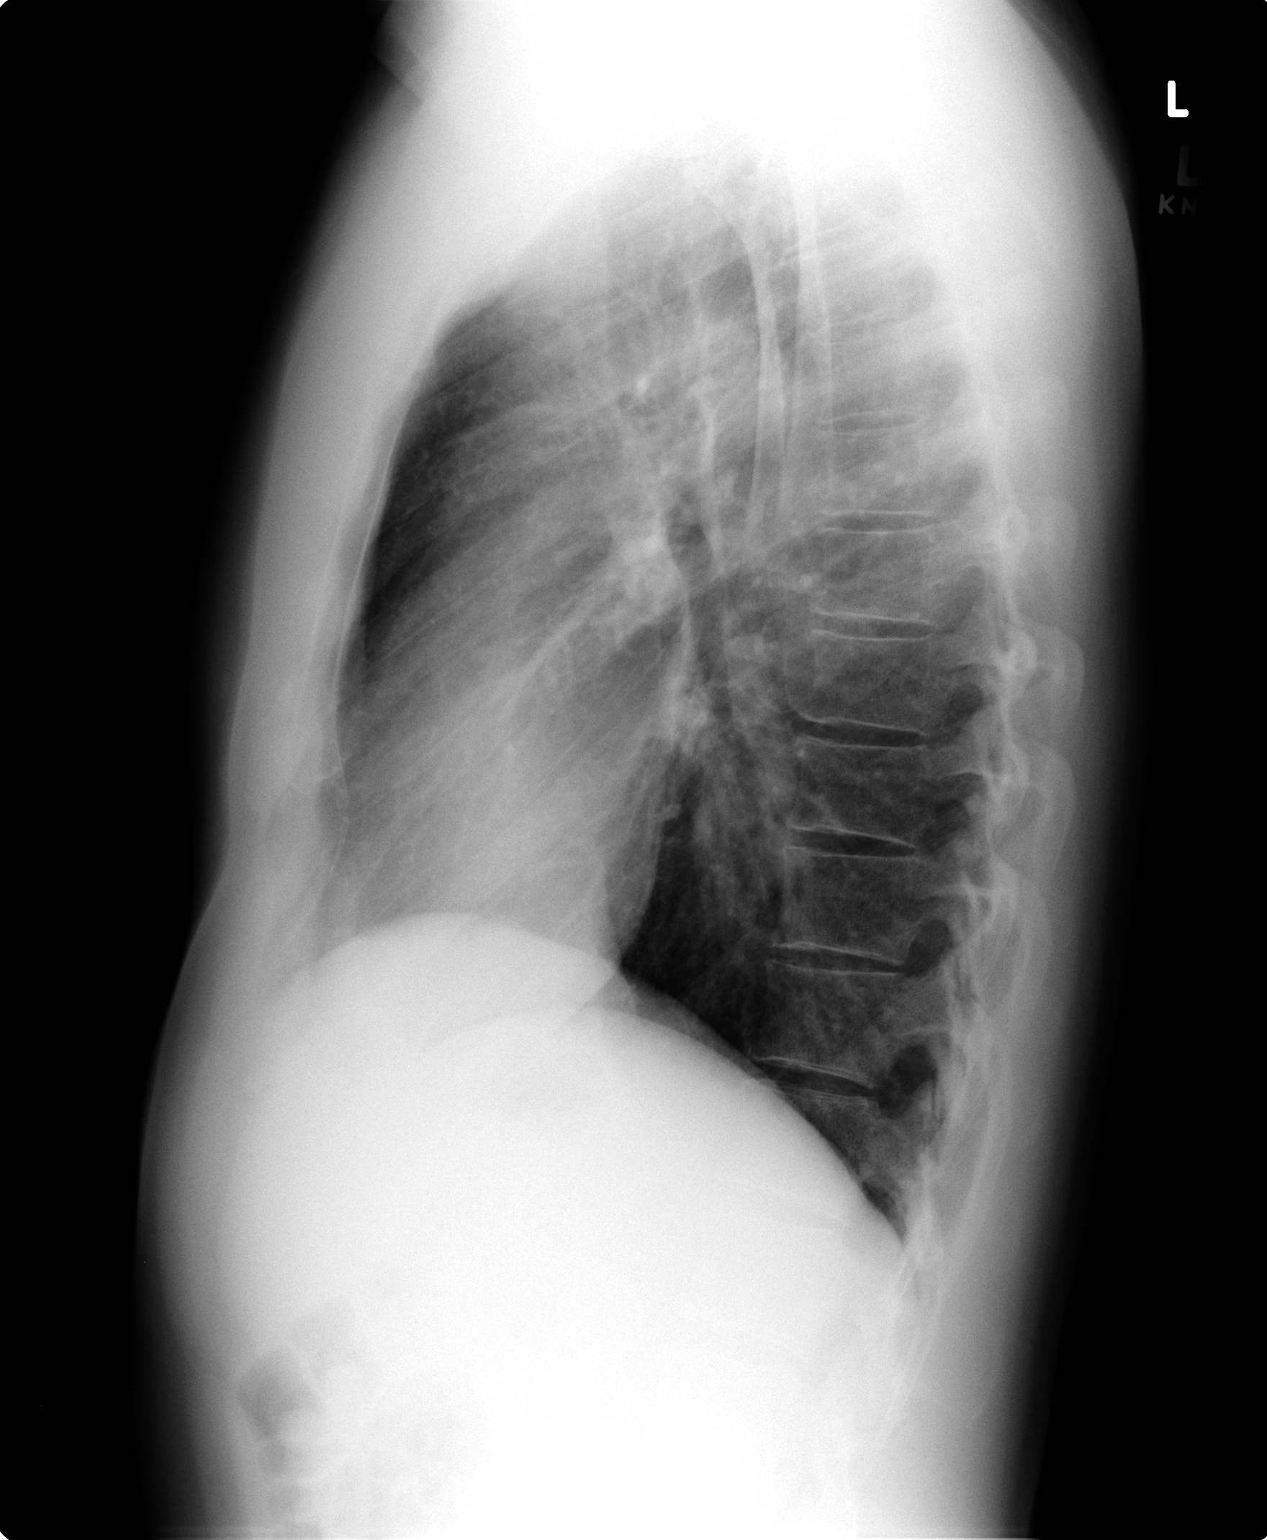

[2 of 2 positions shown; findings below may reference images not displayed]

FINDINGS: The cardiac silhouette, mediastinal and hilar contours are within
normal limits. The lungs are clear. No pleural effusion. The bony
thorax is intact.
IMPRESSION: No acute cardiopulmonary findings.

## 2015-05-15 ENCOUNTER — Encounter: Payer: Self-pay | Admitting: *Deleted

## 2015-05-22 ENCOUNTER — Encounter: Payer: Managed Care, Other (non HMO) | Admitting: Gastroenterology

## 2015-09-03 ENCOUNTER — Ambulatory Visit: Payer: Managed Care, Other (non HMO) | Admitting: Family Medicine

## 2015-09-03 ENCOUNTER — Ambulatory Visit (INDEPENDENT_AMBULATORY_CARE_PROVIDER_SITE_OTHER): Payer: Managed Care, Other (non HMO) | Admitting: Family Medicine

## 2015-09-03 ENCOUNTER — Encounter: Payer: Self-pay | Admitting: Family Medicine

## 2015-09-03 VITALS — BP 108/64 | HR 84 | Temp 98.6°F | Ht 67.0 in | Wt 151.7 lb

## 2015-09-03 DIAGNOSIS — M25561 Pain in right knee: Secondary | ICD-10-CM | POA: Insufficient documentation

## 2015-09-03 MED ORDER — DICLOFENAC SODIUM 75 MG PO TBEC
75.0000 mg | DELAYED_RELEASE_TABLET | Freq: Two times a day (BID) | ORAL | 0 refills | Status: DC
Start: 2015-09-03 — End: 2016-07-05

## 2015-09-03 NOTE — Progress Notes (Signed)
Pre visit review using our clinic review tool, if applicable. No additional management support is needed unless otherwise documented below in the visit note. 

## 2015-09-03 NOTE — Progress Notes (Signed)
Subjective:    Patient ID: William Norris, male    DOB: [birth year], 60 y.o.   MRN: [id number]  HPI  William Norris is a 60 year old male who presents today with right knee discomfort. Trigger noted after starting a recent spin class 2 weeks ago where pain was noted later that day.  Discomfort is noted as minimal and present only when twisting knee medially.  Associated "popping and crunching" is reported by patient. William Norris denies hearing a "pop or click" when discomfort started. William Norris stated that William Norris had moved his seat too low in his spin class and believes this may have contributed to this issue.  History of meniscus tear bilaterally about 12 years ago.   William Norris denies any weakness, numbness, tingling, edema, or problems with ambulation.   William Norris runs twice weekly and reports that William Norris was running 3 miles at a time prior to this knee discomfort.  William Norris has discontinued running since this symptom started. Treatment at home includes ibuprofen for one dose which provided limited benefit.   Review of Systems  Constitutional: Negative for chills, fatigue and fever.  Respiratory: Negative for cough, shortness of breath and wheezing.   Cardiovascular: Negative for chest pain, palpitations and leg swelling.  Gastrointestinal: Negative for abdominal pain, constipation, diarrhea, nausea and vomiting.  Musculoskeletal: Negative for joint swelling.       Right knee pain  Skin: Negative for rash.   Past Medical History:  Diagnosis Date  . Insomnia   . Internal hemorrhoids   . Migraine   . Skull fracture ([place])    no neurologic sequelae  . Torn Achilles tendon    right     Social History   Social History  . Marital status: Married    Spouse name: N/A  . Number of children: 1  . Years of education: N/A   Occupational History  . Not on file.   Social History Main Topics  . Smoking status: Never Smoker  . Smokeless tobacco: Never Used  . Alcohol use 0.0 oz/week  . Drug use: No  . Sexual activity: Not on file     Other Topics Concern  . Not on file   Social History Narrative  . No narrative on file    Past Surgical History:  Procedure Laterality Date  . KNEE CARTILAGE SURGERY     x2    Family History  Problem Relation Age of Onset  . Osteoporosis Mother   . Irritable bowel syndrome Mother   . Glaucoma Brother   . Colon cancer      negative hx    No Known Allergies  Current Outpatient Prescriptions on File Prior to Visit  Medication Sig Dispense Refill  . diphenhydramine-acetaminophen (TYLENOL PM) 25-500 MG TABS tablet Take 1 tablet by mouth at bedtime as needed.    . traZODone (DESYREL) 100 MG tablet Take 1 tablet (100 mg total) by mouth at bedtime. 100 tablet 3  . zolmitriptan (ZOMIG-ZMT) 5 MG disintegrating tablet TAKE 1 TABLET BY MOUTH AS DIRECTED FOR MIGRAINE 15 tablet 1   No current facility-administered medications on file prior to visit.     BP 108/64 (BP Location: Left Arm, Patient Position: Sitting, Cuff Size: Normal)   Pulse 84   Temp 98.6 F (37 C) (Oral)   Ht 5\' 7"  (1.702 m)   Wt 151 lb 11.2 oz (68.8 kg)   SpO2 97%   BMI 23.76 kg/m        Objective:   Physical  Exam  Constitutional: William Norris is oriented to person, place, and time. William Norris appears well-developed and well-nourished.  Eyes: Pupils are equal, round, and reactive to light. No scleral icterus.  Neck: Neck supple.  Cardiovascular: Normal rate, regular rhythm and intact distal pulses.   Pulmonary/Chest: Effort normal and breath sounds normal. William Norris has no wheezes. William Norris has no rales.  Abdominal: Soft. Bowel sounds are normal. There is no tenderness.  Musculoskeletal: William Norris exhibits no edema.       Right knee: William Norris exhibits normal range of motion, no swelling, no effusion, no ecchymosis, no deformity, no erythema, normal alignment and normal patellar mobility.  No swelling or tenderness over the patella. Mild tenderness over the medial aspect of the right knee slightly superior of the patella. Knee is intact with full  ROM present without increase in discomfort. No pain medially or laterally with rotation. No ecchymosis present. Possible click heard with McMurray test during mid extension. Patient is able to ambulate without difficulty or antalgic gait. William Norris is able to squat without difficulty.  Lymphadenopathy:    William Norris has no cervical adenopathy.  Neurological: William Norris is alert and oriented to person, place, and time. Coordination normal.  Skin: Skin is warm and dry. No rash noted.  Psychiatric: William Norris has a normal mood and affect. His behavior is normal. Judgment and thought content normal.      Assessment & Plan:  1. Right knee pain Suspect that right knee pain may be related to possible inflammation due to recent initiation of new exercise of spin class.. Discussed possible etiologies of tendonitis, osteoarthritis, exacerbation of meniscus tear.  Advised trial of antiinflammatory and imaging to rule out bony abnormalities or meniscus tear.  - diclofenac (VOLTAREN) 75 MG EC tablet; Take 1 tablet (75 mg total) by mouth 2 (two) times daily.  Dispense: 60 tablet; Refill: 0  - DG Knee Complete 4 Views Left; Future  Delano Metz, FNP-C

## 2015-09-03 NOTE — Patient Instructions (Signed)
It was a pleasure to see you today.  Please go to [facility] [place] for your imaging.  I will contact you within one week or sooner regarding your X-ray.  Please use medication as directed with food as needed for discomfort.   Knee Pain Knee pain is a very common symptom and can have many causes. Knee pain often goes away when you follow your health care provider's instructions for relieving pain and discomfort at home. However, knee pain can develop into a condition that needs treatment. Some conditions may include:  Arthritis caused by wear and tear (osteoarthritis).  Arthritis caused by swelling and irritation (rheumatoid arthritis or gout).  A cyst or growth in your knee.  An infection in your knee joint.  An injury that will not heal.  Damage, swelling, or irritation of the tissues that support your knee (torn ligaments or tendinitis). If your knee pain continues, additional tests may be ordered to diagnose your condition. Tests may include X-rays or other imaging studies of your knee. You may also need to have fluid removed from your knee. Treatment for ongoing knee pain depends on the cause, but treatment may include:  Medicines to relieve pain or swelling.  Steroid injections in your knee.  Physical therapy.  Surgery. HOME CARE INSTRUCTIONS  Take medicines only as directed by your health care provider.  Rest your knee and keep it raised (elevated) while you are resting.  Do not do things that cause or worsen pain.  Avoid high-impact activities or exercises, such as running, jumping rope, or doing jumping jacks.  Apply ice to the knee area:  Put ice in a plastic bag.  Place a towel between your skin and the bag.  Leave the ice on for 20 minutes, 2-3 times a day.  Ask your health care provider if you should wear an elastic knee support.  Keep a pillow under your knee when you sleep.  Lose weight if you are overweight. Extra weight can put pressure on your  knee.  Do not use any tobacco products, including cigarettes, chewing tobacco, or electronic cigarettes. If you need help quitting, ask your health care provider. Smoking may slow the healing of any bone and joint problems that you may have. SEEK MEDICAL CARE IF:  Your knee pain continues, changes, or gets worse.  You have a fever along with knee pain.  Your knee buckles or locks up.  Your knee becomes more swollen. SEEK IMMEDIATE MEDICAL CARE IF:   Your knee joint feels hot to the touch.  You have chest pain or trouble breathing.   This information is not intended to replace advice given to you by your health care provider. Make sure you discuss any questions you have with your health care provider.   Document Released: 10/24/2006 Document Revised: 01/17/2014 Document Reviewed: 08/12/2013 Elsevier Interactive Patient Education [employer].

## 2015-09-04 ENCOUNTER — Encounter: Payer: Self-pay | Admitting: Family Medicine

## 2015-09-07 ENCOUNTER — Ambulatory Visit (INDEPENDENT_AMBULATORY_CARE_PROVIDER_SITE_OTHER)
Admission: RE | Admit: 2015-09-07 | Discharge: 2015-09-07 | Disposition: A | Discharge status: Home / Self Care | Payer: Managed Care, Other (non HMO) | Source: Ambulatory Visit | Attending: Family Medicine | Admitting: Family Medicine

## 2015-09-07 DIAGNOSIS — M25561 Pain in right knee: Secondary | ICD-10-CM | POA: Diagnosis not present

## 2015-09-11 ENCOUNTER — Telehealth: Payer: Self-pay | Admitting: Family Medicine

## 2015-09-11 NOTE — Telephone Encounter (Signed)
Pt would like a call back about xray results  °

## 2015-09-15 NOTE — Telephone Encounter (Signed)
Image documentation was completed on 09/08/2015 and there is a note stating patient was contacted with these results on 09/11/2015. This is located under his image as a result note. Please let me know if additional information is needed.

## 2015-09-15 NOTE — Telephone Encounter (Signed)
Have you reviewed this X-ray ?

## 2015-09-16 NOTE — Telephone Encounter (Signed)
Called and spoke with pt states he already received the results.

## 2015-10-09 ENCOUNTER — Other Ambulatory Visit: Payer: Self-pay | Admitting: Family Medicine

## 2015-12-16 ENCOUNTER — Other Ambulatory Visit: Payer: Self-pay | Admitting: Family Medicine

## 2016-07-05 ENCOUNTER — Encounter: Payer: Self-pay | Admitting: Family Medicine

## 2016-07-05 ENCOUNTER — Ambulatory Visit (INDEPENDENT_AMBULATORY_CARE_PROVIDER_SITE_OTHER): Payer: Managed Care, Other (non HMO) | Admitting: Family Medicine

## 2016-07-05 VITALS — BP 120/78 | HR 70 | Resp 12 | Ht 67.0 in | Wt 143.4 lb

## 2016-07-05 DIAGNOSIS — M79661 Pain in right lower leg: Secondary | ICD-10-CM | POA: Diagnosis not present

## 2016-07-05 MED ORDER — BACLOFEN 10 MG PO TABS: 10.0000 mg | ORAL_TABLET | Freq: Three times a day (TID) | ORAL | 0 refills | Status: AC

## 2016-07-05 NOTE — Patient Instructions (Signed)
  William Norris I have seen you today for an acute visit.  A few things to remember from today's visit:   Right calf pain - Plan: baclofen (LIORESAL) 10 MG tablet     Topical Icy hot with Lidocaine. Monitor for edema or erythema,shortness or breath a mong some.    Medications prescribed today are intended for short period of time and will not be refill upon request, a follow up appointment might be necessary to discuss continuation of of treatment if appropriate.     In general please monitor for signs of worsening symptoms and seek immediate medical attention if any concerning.  If symptoms are not resolved in about 3-4 weeks you should schedule a follow up appointment with your doctor, before if needed.  Please be sure you have an appointment already scheduled with your PCP before you leave today.

## 2016-07-05 NOTE — Progress Notes (Signed)
HPI:   ACUTE VISIT:  Chief Complaint  Patient presents with  . calf pain    William Norris is a 61 y.o. male, who is here today complaining of 3 days of right calf pain, constant.  Pain started on lateral aspect of calf and today felt it extending to area behind knee and distal thigh.  He exercises regularly, he has not had any injury. He denies any chest pain, palpitation, dyspnea, or dizziness. She denies prior history. He denies associated back pain. No cyanosis, numbness, burning, tingling, or focal deficit. He has tried OTC topical medications.  Pain is between 3/10 and 5/10. Pain is "less" now.  He was having some limping yesterday at the end of the day.   Leg Pain   The incident occurred 3 to 5 days ago. There was no injury mechanism. The pain is at a severity of 5/10. The pain is moderate. The pain has been constant since onset. Pertinent negatives include no inability to bear weight, loss of motion, loss of sensation, muscle weakness, numbness or tingling. Treatments tried: Topical OTC. The treatment provided mild relief.   Pain is exacerbated by prolonged sitting, like when he drives and front of chair applies pressure behind knee or reaching accelerator. Alleviated by standing up and walking.  He has not noted edema,erythema,skin rash, or ecchymosis. No recent long travel or surgery.   Review of Systems  Constitutional: Negative for chills, fatigue and fever.  Respiratory: Negative for chest tightness, shortness of breath and wheezing.   Cardiovascular: Negative for palpitations and leg swelling.  Gastrointestinal: Negative for abdominal pain, nausea and vomiting.  Genitourinary: Negative for decreased urine volume and hematuria.  Musculoskeletal: Positive for myalgias. Negative for back pain, gait problem and joint swelling.  Skin: Negative for pallor and rash.  Neurological: Negative for tingling, syncope, weakness and numbness.  Hematological:  Negative for adenopathy. Does not bruise/bleed easily.  Psychiatric/Behavioral: Negative for confusion and sleep disturbance. The patient is nervous/anxious.       Current Outpatient Prescriptions on File Prior to Visit  Medication Sig Dispense Refill  . diphenhydramine-acetaminophen (TYLENOL PM) 25-500 MG TABS tablet Take 1 tablet by mouth at bedtime as needed.    . traZODone (DESYREL) 100 MG tablet take 1 tablet by mouth at bedtime 100 tablet 3  . zolmitriptan (ZOMIG-ZMT) 5 MG disintegrating tablet take 1 tablet by mouth as directed if needed for migraines 15 tablet 1   No current facility-administered medications on file prior to visit.      Past Medical History:  Diagnosis Date  . Insomnia   . Internal hemorrhoids   . Migraine   . Skull fracture ([place])    no neurologic sequelae  . Torn Achilles tendon    right   No Known Allergies  Social History   Social History  . Marital status: Married    Spouse name: N/A  . Number of children: 1  . Years of education: N/A   Social History Main Topics  . Smoking status: Never Smoker  . Smokeless tobacco: Never Used  . Alcohol use 0.0 oz/week  . Drug use: No  . Sexual activity: Not Asked   Other Topics Concern  . None   Social History Narrative  . None    Vitals:   07/05/16 1505  BP: 120/78  Pulse: 70  Resp: 12  O2 sat at RA 98% Body mass index is 22.46 kg/m.   Physical Exam  Nursing note and vitals reviewed.  Constitutional: He is oriented to person, place, and time. He appears well-developed and well-nourished. No distress.  HENT:  Head: Atraumatic.  Eyes: Conjunctivae and EOM are normal.  Cardiovascular: Normal rate and regular rhythm.   Pulses:      Dorsalis pedis pulses are 2+ on the right side, and 2+ on the left side.       Posterior tibial pulses are 2+ on the right side.  Respiratory: Effort normal and breath sounds normal. No respiratory distress.  GI: Soft. He exhibits no mass. There is no  tenderness.  Musculoskeletal: He exhibits no edema.       Lumbar back: He exhibits no tenderness and no bony tenderness.  No deformity appreciated. Hip and knee ROM with no limitation and no pain elicited. Mild tenderness upon palpation of lateral aspect of calf. Holman's sign negative.    Neurological: He is alert and oriented to person, place, and time. He has normal strength. Gait normal.  RLS negative bilateral.   Skin: Skin is warm. No rash noted. No erythema.  Psychiatric: He has a normal mood and affect.  Well groomed,good eye contact.    ASSESSMENT AND PLAN:   [name] was seen today for calf pain.  Diagnoses and all orders for this visit:  Right calf pain -     baclofen (LIORESAL) 10 MG tablet; Take 1 tablet (10 mg total) by mouth 3 (three) times daily.   We discussed possible causes including musculoskeletal (muscle strain),radicular pain, and less likely DVT. Clinically the likelihood of a serious process like DVT is low. He understands and prefers to hold on further imaging. Muscle relaxant may help,some side effects discussed. Local heat/ice. Topical OTC Icy hot at night. Stretching and ROM exercises. Instructed about warning signs. F/U with PCP as needed.   -[name] advised to seek immediate medical attention if symptoms suddenly get worse or to follow if symptoms persist or new concerns arise.       Betty G. Martinique, MD  [facility]. [place] office.

## 2016-08-25 ENCOUNTER — Encounter: Payer: Self-pay | Admitting: Adult Health

## 2016-08-25 ENCOUNTER — Ambulatory Visit (INDEPENDENT_AMBULATORY_CARE_PROVIDER_SITE_OTHER): Payer: Managed Care, Other (non HMO) | Admitting: Adult Health

## 2016-08-25 VITALS — BP 102/70 | Temp 98.4°F | Wt 140.0 lb

## 2016-08-25 DIAGNOSIS — M545 Low back pain: Secondary | ICD-10-CM

## 2016-08-25 DIAGNOSIS — L237 Allergic contact dermatitis due to plants, except food: Secondary | ICD-10-CM | POA: Diagnosis not present

## 2016-08-25 DIAGNOSIS — G8929 Other chronic pain: Secondary | ICD-10-CM

## 2016-08-25 MED ORDER — PREDNISONE 20 MG PO TABS: ORAL_TABLET | ORAL | 0 refills | Status: DC

## 2016-08-25 NOTE — Progress Notes (Addendum)
Subjective:    Patient ID: William Norris, male    DOB: 07/12/1955, 61 y.o.   MRN: [id number]  HPI  61 year old male who  has a past medical history of Insomnia; Internal hemorrhoids; Migraine; Skull fracture (HCC); and Torn Achilles tendon. He is a patient of Dr. Sherren Mocha, who I am seeing today for an acute issue of poison ivy dermatitis. He reports that he was working outside earlier in the week and a few days later he noticed a rash on his arms, legs and torso.   He is also complaining of chronic low back pain. He would like to do PT. In the past he has done massage, which he endorses has helped in the past. Denies any pain today    Review of Systems See HPI   Past Medical History:  Diagnosis Date  . Insomnia   . Internal hemorrhoids   . Migraine   . Skull fracture ([place])    no neurologic sequelae  . Torn Achilles tendon    right    Social History   Social History  . Marital status: Married    Spouse name: N/A  . Number of children: 1  . Years of education: N/A   Occupational History  . Not on file.   Social History Main Topics  . Smoking status: Never Smoker  . Smokeless tobacco: Never Used  . Alcohol use 0.0 oz/week  . Drug use: No  . Sexual activity: Not on file   Other Topics Concern  . Not on file   Social History Narrative  . No narrative on file    Past Surgical History:  Procedure Laterality Date  . KNEE CARTILAGE SURGERY     x2    Family History  Problem Relation Age of Onset  . Glaucoma Brother   . Osteoporosis Mother   . Irritable bowel syndrome Mother   . Colon cancer Unknown        negative hx    No Known Allergies  Current Outpatient Prescriptions on File Prior to Visit  Medication Sig Dispense Refill  . diphenhydramine-acetaminophen (TYLENOL PM) 25-500 MG TABS tablet Take 1 tablet by mouth at bedtime as needed.    . traZODone (DESYREL) 100 MG tablet take 1 tablet by mouth at bedtime 100 tablet 3  . zolmitriptan (ZOMIG-ZMT) 5 MG  disintegrating tablet take 1 tablet by mouth as directed if needed for migraines 15 tablet 1   No current facility-administered medications on file prior to visit.     BP 102/70 (BP Location: Right Arm)   Temp 98.4 F (36.9 C) (Oral)   Wt 140 lb (63.5 kg)   BMI 21.93 kg/m       Objective:   Physical Exam  Constitutional: He appears well-developed and well-nourished. No distress.  Cardiovascular: Normal rate, regular rhythm, normal heart sounds and intact distal pulses.  Exam reveals no gallop.   No murmur heard. Pulmonary/Chest: Effort normal and breath sounds normal. No respiratory distress. He has no wheezes. He has no rales. He exhibits no tenderness.  Musculoskeletal: Normal range of motion. He exhibits no edema, tenderness or deformity.  Neurological: He is alert.  Skin: He is not diaphoretic.  Red, bolus rash located throughout arms  legs and torso. No weeping noted. No signs of infection   Nursing note and vitals reviewed.     Assessment & Plan:  1. Chronic bilateral low back pain without sciatica - Ambulatory referral to Physical Therapy  2. Poison ivy  dermatitis - predniSONE (DELTASONE) 20 MG tablet; 40 mg x 7 days, 20 mg x 7 days  Dispense: 21 tablet; Refill: 0  Dorothyann Peng, NP

## 2016-09-17 ENCOUNTER — Other Ambulatory Visit: Payer: Self-pay | Admitting: Family Medicine

## 2016-10-05 ENCOUNTER — Encounter: Payer: Self-pay | Admitting: Family Medicine

## 2016-10-05 ENCOUNTER — Ambulatory Visit (INDEPENDENT_AMBULATORY_CARE_PROVIDER_SITE_OTHER): Payer: Managed Care, Other (non HMO) | Admitting: Family Medicine

## 2016-10-05 VITALS — BP 100/70 | HR 61 | Resp 12 | Ht 67.0 in | Wt 140.5 lb

## 2016-10-05 DIAGNOSIS — R110 Nausea: Secondary | ICD-10-CM

## 2016-10-05 DIAGNOSIS — M549 Dorsalgia, unspecified: Secondary | ICD-10-CM | POA: Diagnosis not present

## 2016-10-05 DIAGNOSIS — M25511 Pain in right shoulder: Secondary | ICD-10-CM

## 2016-10-05 MED ORDER — CELECOXIB 100 MG PO CAPS: 100.0000 mg | ORAL_CAPSULE | Freq: Two times a day (BID) | ORAL | 0 refills | Status: DC

## 2016-10-05 MED ORDER — METHOCARBAMOL 500 MG PO TABS: 500.0000 mg | ORAL_TABLET | Freq: Three times a day (TID) | ORAL | 0 refills | Status: DC | PRN

## 2016-10-05 NOTE — Patient Instructions (Signed)
A few things to remember from today's visit:   Upper back pain on right side - Plan: methocarbamol (ROBAXIN) 500 MG tablet, Ambulatory referral to Physical Therapy  Acute pain of right shoulder - Plan: celecoxib (CELEBREX) 100 MG capsule  Nausea without vomiting  Can take also Tylenol 650 mg 3 times per day with these medications.  If not better in 3-4 weeks please let us know.   Please be sure medication list is accurate. If a new problem present, please set up appointment sooner than planned today.

## 2016-10-05 NOTE — Progress Notes (Signed)
ACUTE VISIT   HPI:  Chief Complaint  Patient presents with  . right shoulder pain  . Nausea    [name] is a 61 y.o. male, who is here today complaining of 2.5 weeks of right shoulder pain. He denies any injury or unusual activity, he usually lifts weights but has not changed in intensity.   Pain is constant, and testicular aspect of right shoulder and radiated to interscapular area. She has had similar pain in the past, he has received "steroid injections."   Shoulder Pain   The current episode started 1 to 4 weeks ago. There has been no history of extremity trauma. The problem occurs constantly. The problem has been gradually worsening. The pain is at a severity of 7/10. The pain is severe. Associated symptoms include a limited range of motion. Pertinent negatives include no fever, joint locking, joint swelling, numbness or tingling. The symptoms are aggravated by activity. He has tried NSAIDS for the symptoms. The treatment provided mild relief. There is no history of diabetes.  Pain is keeping him from sleep, waking him up. + Nausea,intermittent and attributed to pain.  Massage helped temporarily.  He is also having 2 months of right elbow pain, exacerbated by some movements and alleviated by rest. He denies elbow edema or erythema. No limitation of movement. She denies weakness, numbness, tingling, or cold extremity.  Review of Systems  Constitutional: Negative for diaphoresis, fatigue and fever.  HENT: Negative for mouth sores, sore throat and trouble swallowing.   Respiratory: Negative for cough, shortness of breath and wheezing.   Cardiovascular: Negative for chest pain and palpitations.  Gastrointestinal: Positive for nausea. Negative for abdominal pain and vomiting.       Denies changes in bowel habits.  Genitourinary: Negative for decreased urine volume and hematuria.  Musculoskeletal: Positive for arthralgias and back pain. Negative for joint  swelling and neck pain.  Skin: Negative for pallor, rash and wound.  Neurological: Negative for tingling, weakness and numbness.  Psychiatric/Behavioral: Positive for sleep disturbance. Negative for confusion. The patient is nervous/anxious.       Current Outpatient Prescriptions on File Prior to Visit  Medication Sig Dispense Refill  . traZODone (DESYREL) 100 MG tablet take 1 tablet by mouth at bedtime 100 tablet 3  . zolmitriptan (ZOMIG-ZMT) 5 MG disintegrating tablet TAKE 1 TABLET BY MOUTH AS DIRECTED IF NEEDED FOR MIGRAINES 15 tablet 0   No current facility-administered medications on file prior to visit.      Past Medical History:  Diagnosis Date  . Insomnia   . Internal hemorrhoids   . Migraine   . Skull fracture ([place])    no neurologic sequelae  . Torn Achilles tendon    right   No Known Allergies  Social History   Social History  . Marital status: Married    Spouse name: N/A  . Number of children: 1  . Years of education: N/A   Social History Main Topics  . Smoking status: Never Smoker  . Smokeless tobacco: Never Used  . Alcohol use 0.0 oz/week  . Drug use: No  . Sexual activity: Not Asked   Other Topics Concern  . None   Social History Narrative  . None    Vitals:   10/05/16 1524  BP: 100/70  Pulse: 61  Resp: 12  SpO2: 98%   Body mass index is 22.01 kg/m.   Physical Exam  Nursing note and vitals reviewed. Constitutional: He is oriented to  person, place, and time. He appears well-developed and well-nourished. No distress.  HENT:  Head: Normocephalic and atraumatic.  Eyes: Conjunctivae are normal.  Cardiovascular: Normal rate and regular rhythm.   Pulses:      Radial pulses are 2+ on the right side.  Respiratory: Effort normal and breath sounds normal. No respiratory distress.  Musculoskeletal: He exhibits no edema.       Cervical back: He exhibits spasm.       Back:  Shoulder: No deformity, edema, or erythema appreciated.No muscle  atrophy. Tenderness to palpation of the muscles around right scapula,trapezium, and posterior aspect of right shoulder. No limitation of ROM but elicits pain.  Right elbow no edema ore erythema. Pain upon palpation of medial peri epicondyle area and proximal forearm lateral  muscles . Pain is not elicited with supination or pronation.   Lymphadenopathy:    He has no cervical adenopathy.  Neurological: He is alert and oriented to person, place, and time. He has normal strength. Coordination and gait normal.  Skin: No rash noted. No erythema.  Psychiatric: His mood appears anxious.  Well groomed,good eye contact.    ASSESSMENT AND PLAN:   Mr. William Norris was seen today for right shoulder pain and nausea.  Diagnoses and all orders for this visit:  Upper back pain on right side  No Hx of trauma or deformities,so I do not think imaging is needed today. Local heat,massage recommended. Some side effects of muscle relaxant, Methocarbamol, discussed. Chiropractic treatment and PT might help, he would like to try PT. Instructed about warning signs.  -     methocarbamol (ROBAXIN) 500 MG tablet; Take 1 tablet (500 mg total) by mouth every 8 (eight) hours as needed for muscle spasms. -     Ambulatory referral to Physical Therapy  Acute pain of right shoulder  ? Sprain. Rotator cuff maneuvers negative. Celebrex side effects discussed. He can add OTC Acetaminophen as well. PT will be arranged.  -     celecoxib (CELEBREX) 100 MG capsule; Take 1 capsule (100 mg total) by mouth 2 (two) times daily.  Nausea without vomiting  Attributed to pain, no associated other GI symptoms. Instructed about warning signs. Follow-up as needed.   -[name] was advised to seek immediate medical attention if sudden worsening symptoms or to follow if they persist or if new concerns arise.       Betty G. Martinique, MD  [facility]. [place] office.

## 2016-10-07 ENCOUNTER — Encounter: Payer: Self-pay | Admitting: Family Medicine

## 2016-10-18 ENCOUNTER — Ambulatory Visit: Payer: Managed Care, Other (non HMO) | Attending: Family Medicine | Admitting: Physical Therapy

## 2016-10-18 DIAGNOSIS — R293 Abnormal posture: Secondary | ICD-10-CM | POA: Diagnosis present

## 2016-10-18 DIAGNOSIS — M546 Pain in thoracic spine: Secondary | ICD-10-CM

## 2016-10-18 DIAGNOSIS — M6281 Muscle weakness (generalized): Secondary | ICD-10-CM | POA: Diagnosis present

## 2016-10-18 NOTE — Therapy (Signed)
[facility] [address] [place], [place], [address] Phone: [phone]   Fax:  [phone]  Physical Therapy Evaluation  Patient Details  Name: William Norris MRN: [id number] Date of Birth: 01/22/1955 Referring Provider: Dr. Betty Martinique  Encounter Date: 10/18/2016      PT End of Session - 10/18/16 1036    Visit Number 1   Number of Visits 30   Date for PT Re-Evaluation 12/13/16   Authorization Type 30 visit limit   PT Start Time 0847   PT Stop Time 0930   PT Time Calculation (min) 43 min   Activity Tolerance Patient tolerated treatment well      Past Medical History:  Diagnosis Date  . Insomnia   . Internal hemorrhoids   . Migraine   . Skull fracture ([place])    no neurologic sequelae  . Torn Achilles tendon    right    Past Surgical History:  Procedure Laterality Date  . KNEE CARTILAGE SURGERY     x2    There were no vitals filed for this visit.       Subjective Assessment - 10/18/16 0849    Subjective 2-3 month history of right scapular pain   Pertinent History history of neck pain   Limitations Sitting   How long can you sit comfortably? at work 30 min   How long can you walk comfortably? unlimited   Diagnostic tests MRI cervical spine spurring   Patient Stated Goals be with less pain and stretching techniques;  strength muscle groups   Currently in Pain? Yes   Pain Score 7    Pain Location Scapula   Pain Orientation Right   Pain Type Chronic pain   Aggravating Factors  work on computer   Pain Relieving Factors massage, ball behind back,  stretching overhead            [facility] PT Assessment - 10/18/16 0001      Assessment   Medical Diagnosis upper back pain right side   Referring Provider Dr. Betty Martinique   Onset Date/Surgical Date --  2-3 months   Hand Dominance Right   Next MD Visit as needed 3-4 weeks   Prior Therapy knee, achilles, neck     Precautions   Precautions None      Restrictions   Weight Bearing Restrictions No     Balance Screen   Has the patient fallen in the past 6 months No   Has the patient had a decrease in activity level because of a fear of falling?  No   Is the patient reluctant to leave their home because of a fear of falling?  No     Home Environment   Living Environment Private residence   Living Arrangements Spouse/significant other   Available Help at Discharge Family   Type of [place]     Prior Function   Level of [place] Full time employment   Vocation Requirements computer work   Leisure work out, ride mountain bike, walk dog     Observation/Other Assessments   Focus on Therapeutic Outcomes (FOTO)  39% limitation     Posture/Postural Control   Posture/Postural Control Postural limitations   Postural Limitations Rounded Shoulders;Forward head   Posture Comments right scapular early elevation with UE movement and wall push up     AROM   AROM Assessment Site --  UE full ROM but painful in subscapularis/posterior axilla   Cervical  Flexion 60   Cervical Extension 60   Cervical - Right Side Bend 40   Cervical - Left Side Bend 20   Cervical - Right Rotation 45   Cervical - Left Rotation 40   Thoracic Extension --  decreased thoracic extension mobility     Strength   Strength Assessment Site --  Periscapular strength grossly 4+/5   Cervical Flexion 4/5   Cervical Extension 4/5     Palpation   Palpation comment multiple tender points right periscapular muscles rhomboids, subscapularis, infraspinatus, levator scap     Distraction Test   Findngs Negative   Comment no change            Objective measurements completed on examination: See above findings.                  PT Education - 10/18/16 1035    Education provided Yes   Education Details dry needling info;  sitting postural correction; side bending stretch   Person(s) Educated Patient   Methods  Explanation;Demonstration;Handout   Comprehension Verbalized understanding;Returned demonstration          PT Short Term Goals - 10/18/16 2115      PT SHORT TERM GOAL #1   Title The patient will demonstrate improved postural awareness and strategies for self care including use of lumbar roll, standing often   Time 4   Period Weeks   Status New   Target Date 11/15/16     PT SHORT TERM GOAL #2   Title The patient will report a 30% reduction in right scapular pain with usual ADLs   Time 4   Period Weeks   Status New     PT SHORT TERM GOAL #3   Title Left sidebending ROM improved to 30 degrees needed for driving   Time 4   Period Weeks   Status New           PT Long Term Goals - 10/18/16 2118      PT LONG TERM GOAL #1   Title The patient will be independent in safe self progression of HEP   Time 8   Period Weeks   Status New   Target Date 12/13/16     PT LONG TERM GOAL #2   Title The patient will have full and painless shoulder ROM needed for reaching overhead   Time 8   Period Weeks   Status New     PT LONG TERM GOAL #3   Title The patient will report a 60% improvement in pain with usual ADLs   Time 8   Period Weeks   Status New     PT LONG TERM GOAL #4   Title Periscapular strength to 5-/5 needed for lifting/carrying objects   Time 8   Period Weeks   Status New     PT LONG TERM GOAL #5   Title FOTO functional outcome score improved from 39% limitation to 27% indicating improved function with less pain   Time 8   Period Weeks   Status New                Plan - 10/18/16 1037    Clinical Impression Statement The patient reports a 2-3 month history of right scapular region pain that began for no apparent reason although he does have a history of cervical degenerative changes.  He has marked tender points in rhomboids, levator scapularis and subscapularis (especially in axillary region with raising arm overhead).  Cervical  ROM is WFLs except mild  limitation with left sidebending and rotation.  Mild head forward and rounded shoulders.  Patient works on a computer the majority of the time which aggravates symptoms.  Decreased periscapular strength grossly 4+/5 and excessive scapular elevation noted.  He would benefit from PT to address these deficits.     History and Personal Factors relevant to plan of care: history of neck pain with steroid injections;  good home support   Clinical Presentation Stable   Clinical Decision Making Low   Rehab Potential Good   PT Frequency 2x / week   PT Duration 8 weeks   PT Treatment/Interventions ADLs/Self Care Home Management;Cryotherapy;Electrical Stimulation;Ultrasound;Traction;Moist Heat;Therapeutic activities;Therapeutic exercise;Neuromuscular re-education;Patient/family education;Manual techniques;Taping;Dry needling   PT Next Visit Plan dry needling rhomboids, levator, subscapularis (posterior axillary wall);  scapular mobs, thoracic extension;  foam roll ex (patient has one at home)   Consulted and Agree with Plan of Care Patient      Patient will benefit from skilled therapeutic intervention in order to improve the following deficits and impairments:  Pain, Decreased strength, Increased muscle spasms, Increased fascial restricitons, Impaired UE functional use, Decreased range of motion  Visit Diagnosis: Pain in thoracic spine - Plan: PT plan of care cert/re-cert  Muscle weakness (generalized) - Plan: PT plan of care cert/re-cert  Abnormal posture - Plan: PT plan of care cert/re-cert     Problem List Patient Active Problem List   Diagnosis Date Noted  . Right knee pain 09/03/2015  . Left shoulder pain 12/19/2011  . 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 HAND PAIN, RIGHT 11/02/2009  . RHINITIS 03/20/2009  . MIGRAINE WITHOUT AURA 01/29/2009  . DYSFNCT ASSO W/SLEEP STGES/AROUSAL FRM SLEEP 01/29/2009  . SPRAIN&STRAIN OTHER SPECIFIED SITES KNEE&LEG 04/09/2007  . SORE THROAT 11/08/2006    Ruben Im, PT 10/18/16 9:25 PM Phone: [phone] Fax: [phone]  Alvera Singh 10/18/2016, 9:25 PM  [facility] [address], [place], [place], [address] Phone: [phone]   Fax:  [phone]  Name: GIOMAR GUSLER MRN: [id number] Date of Birth: 01/04/1956

## 2016-10-18 NOTE — Patient Instructions (Signed)
   Posture - Standing   Good posture is important. Avoid slouching and forward head thrust. Maintain curve in low back and align ears over shoulders, hips over ankles.  Pull your belly button in toward your back bone. Posture Tips DO: - stand tall and erect - keep chin tucked in - keep head and shoulders in alignment - check posture regularly in mirror or large window - pull head back against headrest in car seat;  Change your position often.  Sit with lumbar support. DON'T: - slouch or slump while watching TV or reading - sit, stand or lie in one position  for too long;  Sitting is especially hard on the spine so if you sit at a desk/use the computer, then stand up often! Copyright  VHI. All rights reserved.  Posture - Sitting  Sit upright, head facing forward. Try using a roll to support lower back. Keep shoulders relaxed, and avoid rounded back. Keep hips level with knees. Avoid crossing legs for long periods. Copyright  VHI. All rights reserved.  Chronic neck strain can develop because of poor posture and faulty work habits  Postural strain related to slumped sitting and forward head posture is a leading cause of headaches, neck and upper back pain  General strengthening and flexibility exercises are helpful in the treatment of neck pain.  Most importantly, you should learn to correct the posture that may be contributing to chronic pain.   Change positions frequently  Change your work or home environment to improve posture and mechanics.   Levator Stretch   Grasp seat or sit on hand on side to be stretched. Turn head toward other side and look down. Use hand on head to gently stretch neck in that position. Hold _20___ seconds. Repeat on other side. Repeat __3__ times. Do __3__ sessions per day.    Ruben Im PT [facility] [address] [place], [place] [address] Phone # [phone] Fax [phone]

## 2016-10-21 ENCOUNTER — Ambulatory Visit: Payer: Managed Care, Other (non HMO) | Admitting: Physical Therapy

## 2016-10-21 ENCOUNTER — Encounter: Payer: Self-pay | Admitting: Physical Therapy

## 2016-10-21 DIAGNOSIS — R293 Abnormal posture: Secondary | ICD-10-CM

## 2016-10-21 DIAGNOSIS — M6281 Muscle weakness (generalized): Secondary | ICD-10-CM

## 2016-10-21 DIAGNOSIS — M546 Pain in thoracic spine: Secondary | ICD-10-CM

## 2016-10-21 NOTE — Patient Instructions (Addendum)
Trigger Point Dry Needling  . What is Trigger Point Dry Needling (DN)? o DN is a physical therapy technique used to treat muscle pain and dysfunction. Specifically, DN helps deactivate muscle trigger points (muscle knots).  o A thin filiform needle is used to penetrate the skin and stimulate the underlying trigger point. The goal is for a local twitch response (LTR) to occur and for the trigger point to relax. No medication of any kind is injected during the procedure.   . What Does Trigger Point Dry Needling Feel Like?  o The procedure feels different for each individual patient. Some patients report that they do not actually feel the needle enter the skin and overall the process is not painful. Very mild bleeding may occur. However, many patients feel a deep cramping in the muscle in which the needle was inserted. This is the local twitch response.   Marland Kitchen How Will I feel after the treatment? o Soreness is normal, and the onset of soreness may not occur for a few hours. Typically this soreness does not last longer than two days.  o Bruising is uncommon, however; ice can be used to decrease any possible bruising.  o In rare cases feeling tired or nauseous after the treatment is normal. In addition, your symptoms may get worse before they get better, this period will typically not last longer than 24 hours.   . What Can I do After My Treatment? o Increase your hydration by drinking more water for the next 24 hours. o You may place ice or heat on the areas treated that have become sore, however, do not use heat on inflamed or bruised areas. Heat often brings more relief post needling. o You can continue your regular activities, but vigorous activity is not recommended initially after the treatment for 24 hours. o DN is best combined with other physical therapy such as strengthening, stretching, and other therapies.    [facility] [address] [place], [place]  [address] Phone # [phone] Fax [phone]   Elbow Back    Place hands behind head and pull elbows back as far as possible. Scapula is to come back and down. Hold __5__ seconds while counting out loud. Repeat __5__ times. Do __2__ sessions per day.  http://gt2.exer.us/555   Copyright  VHI. All rights reserved.  Shoulder Blade Squeeze    Rotate shoulders back, then squeeze shoulder blades together and bring down Repeat _5___ times. Every hour.   http://gt2.exer.us/846   Copyright  VHI. All rights reserved.  CHEST: Doorway, Bilateral - Standing    Standing in doorway, place hands on wall with elbows bent at shoulder height. Lean forward. Hold _15__ seconds. _1__ reps per set, _3__ sets per day, __3_ days per week  Copyright  VHI. All rights reserved.

## 2016-10-21 NOTE — Therapy (Signed)
[facility] [address], [place], [place], [address] Phone: [phone]   Fax:  [phone]  Physical Therapy Treatment  Patient Details  Name: William Norris MRN: [id number] Date of Birth: May 02, 1955 Referring Provider: Dr. Betty Martinique  Encounter Date: 10/21/2016      PT End of Session - 10/21/16 0848    Visit Number 2   Number of Visits 30   Date for PT Re-Evaluation 12/13/16   Authorization Type 30 visit limit   Authorization - Visit Number 2   Authorization - Number of Visits 30   PT Start Time 0845   PT Stop Time 0940   PT Time Calculation (min) 55 min   Activity Tolerance Patient tolerated treatment well   Behavior During Therapy [facility] for tasks assessed/performed      Past Medical History:  Diagnosis Date  . Insomnia   . Internal hemorrhoids   . Migraine   . Skull fracture ([place])    no neurologic sequelae  . Torn Achilles tendon    right    Past Surgical History:  Procedure Laterality Date  . KNEE CARTILAGE SURGERY     x2    There were no vitals filed for this visit.      Subjective Assessment - 10/21/16 0846    Subjective Same pain in same location. I am interested in the dry needling.    Pertinent History history of neck pain   Limitations Sitting   How long can you sit comfortably? at work 30 min   How long can you walk comfortably? unlimited   Diagnostic tests MRI cervical spine spurring   Patient Stated Goals be with less pain and stretching techniques;  strength muscle groups   Currently in Pain? Yes   Pain Score 5    Pain Location Scapula   Pain Orientation Right   Pain Descriptors / Indicators Aching;Sore   Pain Type Chronic pain   Pain Onset More than a month ago   Pain Frequency Constant   Aggravating Factors  work on computer   Pain Relieving Factors massage, ball behind back, stretching overhead   Multiple Pain Sites No                         [facility]  Adult PT Treatment/Exercise - 10/21/16 0001      Manual Therapy   Manual Therapy Joint mobilization;Soft tissue mobilization   Manual therapy comments stretch right pectoralis   Joint Mobilization right rib cage mobilization P-A; right first rib mobilization   Soft tissue mobilization right subscapularis; right pectoralis; right rhomboids; right lattissimus; right trap; right levator ani; right thoracic paraspinals          Trigger Point Dry Needling - 10/21/16 1003    Consent Given? Yes   Education Handout Provided Yes   Muscles Treated Upper Body Rhomboids;Subscapularis;Levator scapulae   Levator Scapulae Response Twitch response elicited;Palpable increased muscle length   Rhomboids Response Twitch response elicited;Palpable increased muscle length   Subscapularis Response Twitch response elicited;Palpable increased muscle length              PT Education - 10/21/16 1004    Education provided Yes   Education Details information on dry needling, scapula strength; doorway stretch   Person(s) Educated Patient   Methods Explanation;Demonstration;Verbal cues;Handout   Comprehension Returned demonstration;Verbalized understanding          PT Short Term Goals - 10/18/16 2115  PT SHORT TERM GOAL #1   Title The patient will demonstrate improved postural awareness and strategies for self care including use of lumbar roll, standing often   Time 4   Period Weeks   Status New   Target Date 11/15/16     PT SHORT TERM GOAL #2   Title The patient will report a 30% reduction in right scapular pain with usual ADLs   Time 4   Period Weeks   Status New     PT SHORT TERM GOAL #3   Title Left sidebending ROM improved to 30 degrees needed for driving   Time 4   Period Weeks   Status New           PT Long Term Goals - 10/18/16 2118      PT LONG TERM GOAL #1   Title The patient will be independent in safe self progression of HEP   Time 8   Period Weeks   Status New    Target Date 12/13/16     PT LONG TERM GOAL #2   Title The patient will have full and painless shoulder ROM needed for reaching overhead   Time 8   Period Weeks   Status New     PT LONG TERM GOAL #3   Title The patient will report a 60% improvement in pain with usual ADLs   Time 8   Period Weeks   Status New     PT LONG TERM GOAL #4   Title Periscapular strength to 5-/5 needed for lifting/carrying objects   Time 8   Period Weeks   Status New     PT LONG TERM GOAL #5   Title FOTO functional outcome score improved from 39% limitation to 27% indicating improved function with less pain   Time 8   Period Weeks   Status New               Plan - 10/21/16 0849    Clinical Impression Statement Patient has tight pectoralis, subscapularis and ribcage.  Patient had twitches in the muscles with elongation.  Patient needs tactile cues to retract and pull shoulder blades downward. Patient pain decreased to 2/10 after therapy.  Patient will benefit from skilled therapy  to improve strength of scapular, work on postural muscles, and improve muscle coordinaiton.    Rehab Potential Good   PT Frequency 2x / week   PT Duration 8 weeks   PT Treatment/Interventions ADLs/Self Care Home Management;Cryotherapy;Electrical Stimulation;Ultrasound;Traction;Moist Heat;Therapeutic activities;Therapeutic exercise;Neuromuscular re-education;Patient/family education;Manual techniques;Taping;Dry needling   PT Next Visit Plan dry needling rhomboids, levator, subscapularis (posterior axillary wall), pectoralis;  scapular mobs, thoracic extension;  foam roll ex (patient has one at home)   PT Home Exercise Plan progress as needed   Recommended Other Services MD signed initial note   Consulted and Agree with Plan of Care Patient      Patient will benefit from skilled therapeutic intervention in order to improve the following deficits and impairments:  Pain, Decreased strength, Increased muscle spasms,  Increased fascial restricitons, Impaired UE functional use, Decreased range of motion  Visit Diagnosis: Pain in thoracic spine  Muscle weakness (generalized)  Abnormal posture     Problem List Patient Active Problem List   Diagnosis Date Noted  . Right knee pain 09/03/2015  . Left shoulder pain 12/19/2011  . 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 HAND PAIN, RIGHT 11/02/2009  . RHINITIS 03/20/2009  . MIGRAINE WITHOUT AURA 01/29/2009  . DYSFNCT ASSO  W/SLEEP STGES/AROUSAL FRM SLEEP 01/29/2009  . SPRAIN&STRAIN OTHER SPECIFIED SITES KNEE&LEG 04/09/2007  . SORE THROAT 11/08/2006    Earlie Counts, PT 10/21/16 10:12 AM   [facility] [address] [place], [place], [address] Phone: [phone]   Fax:  [phone]  Name: William Norris MRN: [id number] Date of Birth: 1955/12/12

## 2016-10-25 ENCOUNTER — Ambulatory Visit: Payer: Managed Care, Other (non HMO) | Admitting: Physical Therapy

## 2016-10-25 DIAGNOSIS — M546 Pain in thoracic spine: Secondary | ICD-10-CM | POA: Diagnosis not present

## 2016-10-25 DIAGNOSIS — M6281 Muscle weakness (generalized): Secondary | ICD-10-CM

## 2016-10-25 DIAGNOSIS — R293 Abnormal posture: Secondary | ICD-10-CM

## 2016-10-25 NOTE — Therapy (Signed)
[facility] [address] [place], [place], [address] Phone: [phone]   Fax:  [phone]  Physical Therapy Treatment  Patient Details  Name: William Norris MRN: [id number] Date of Birth: Jul 18, 1955 Referring Provider: Dr. Betty Martinique  Encounter Date: 10/25/2016      PT End of Session - 10/25/16 0859    Visit Number 3   Number of Visits 30   Date for PT Re-Evaluation 12/13/16   Authorization Type 30 visit limit   Authorization - Visit Number 3   Authorization - Number of Visits 30   PT Start Time 0845   PT Stop Time 0929   PT Time Calculation (min) 44 min   Activity Tolerance Patient tolerated treatment well   Behavior During Therapy [facility] for tasks assessed/performed      Past Medical History:  Diagnosis Date  . Insomnia   . Internal hemorrhoids   . Migraine   . Skull fracture ([place])    no neurologic sequelae  . Torn Achilles tendon    right    Past Surgical History:  Procedure Laterality Date  . KNEE CARTILAGE SURGERY     x2    There were no vitals filed for this visit.      Subjective Assessment - 10/25/16 0851    Subjective Reports dry needling helped, reports he is feeling overall improvement   Pertinent History history of neck pain   Limitations Sitting   How long can you sit comfortably? at work 30 min   How long can you walk comfortably? unlimited   Diagnostic tests MRI cervical spine spurring   Patient Stated Goals be with less pain and stretching techniques;  strength muscle groups   Currently in Pain? Yes   Pain Score 4    Pain Orientation Right   Pain Descriptors / Indicators Aching;Sore   Pain Type Chronic pain   Pain Onset More than a month ago   Pain Frequency Constant   Aggravating Factors  work    Pain Relieving Factors massage                         [facility] Adult PT Treatment/Exercise - 10/25/16 0001      Neck Exercises: Seated   Other Seated  Exercise thoracic extension in chair and on foam roll with cervical support     Shoulder Exercises: Supine   Other Supine Exercises lying on foam roller: pec stretch, horizontal abduction, flexion, serratus punches, diagonals     Manual Therapy   Manual Therapy Joint mobilization;Soft tissue mobilization   Joint Mobilization right rib cage mobilization P-A; right first rib mobilization   Soft tissue mobilization right scalenes, subscapularis; right pectoralis; right rhomboids; right lattissimus; right trap; right levator ani; right thoracic paraspinals                  PT Short Term Goals - 10/25/16 0932      PT SHORT TERM GOAL #1   Title The patient will demonstrate improved postural awareness and strategies for self care including use of lumbar roll, standing often   Time 4   Period Weeks   Status On-going     PT SHORT TERM GOAL #2   Title The patient will report a 30% reduction in right scapular pain with usual ADLs   Time 4   Period Weeks   Status On-going     PT SHORT TERM GOAL #3   Title Left  sidebending ROM improved to 30 degrees needed for driving   Time 4   Period Weeks   Status On-going           PT Long Term Goals - 10/18/16 2118      PT LONG TERM GOAL #1   Title The patient will be independent in safe self progression of HEP   Time 8   Period Weeks   Status New   Target Date 12/13/16     PT LONG TERM GOAL #2   Title The patient will have full and painless shoulder ROM needed for reaching overhead   Time 8   Period Weeks   Status New     PT LONG TERM GOAL #3   Title The patient will report a 60% improvement in pain with usual ADLs   Time 8   Period Weeks   Status New     PT LONG TERM GOAL #4   Title Periscapular strength to 5-/5 needed for lifting/carrying objects   Time 8   Period Weeks   Status New     PT LONG TERM GOAL #5   Title FOTO functional outcome score improved from 39% limitation to 27% indicating improved function with  less pain   Time 8   Period Weeks   Status New               Plan - 10/25/16 0856    Clinical Impression Statement Patient responded well to [facility] techniques and thoracic extension.  He continues to have muscle spasms in rhomboids, anterior deltoid, subclavicular muscles.  Pt did well with exercises on foam roll with some fatigue with stabilizing.  Pt will benefit from skilled PT to continue to work on thoracic mobility and reduced muscle spasm with postural strengthening.   Rehab Potential Good   PT Treatment/Interventions ADLs/Self Care Home Management;Cryotherapy;Electrical Stimulation;Ultrasound;Traction;Moist Heat;Therapeutic activities;Therapeutic exercise;Neuromuscular re-education;Patient/family education;Manual techniques;Taping;Dry needling   PT Next Visit Plan dry needling rhomboids, levator, subscapularis (posterior axillary wall), pectoralis;  scapular mobs, thoracic extension;  progress postural strength   Consulted and Agree with Plan of Care Patient      Patient will benefit from skilled therapeutic intervention in order to improve the following deficits and impairments:  Pain, Decreased strength, Increased muscle spasms, Increased fascial restricitons, Impaired UE functional use, Decreased range of motion  Visit Diagnosis: Pain in thoracic spine  Muscle weakness (generalized)  Abnormal posture     Problem List Patient Active Problem List   Diagnosis Date Noted  . Right knee pain 09/03/2015  . Left shoulder pain 12/19/2011  . 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 HAND PAIN, RIGHT 11/02/2009  . RHINITIS 03/20/2009  . MIGRAINE WITHOUT AURA 01/29/2009  . DYSFNCT ASSO W/SLEEP STGES/AROUSAL FRM SLEEP 01/29/2009  . SPRAIN&STRAIN OTHER SPECIFIED SITES KNEE&LEG 04/09/2007  . SORE THROAT 11/08/2006    Zannie Cove , PT 10/25/2016, 9:34 AM  [facility] [address] [place], [place],  [address] Phone: [phone]   Fax:  [phone]  Name: William Norris MRN: [id number] Date of Birth: 06-09-1955

## 2016-10-26 ENCOUNTER — Other Ambulatory Visit: Payer: Self-pay | Admitting: Family Medicine

## 2016-10-26 DIAGNOSIS — M549 Dorsalgia, unspecified: Secondary | ICD-10-CM

## 2016-10-27 ENCOUNTER — Encounter: Payer: Self-pay | Admitting: Physical Therapy

## 2016-10-27 ENCOUNTER — Ambulatory Visit: Payer: Managed Care, Other (non HMO) | Admitting: Physical Therapy

## 2016-10-27 DIAGNOSIS — M6281 Muscle weakness (generalized): Secondary | ICD-10-CM

## 2016-10-27 DIAGNOSIS — M546 Pain in thoracic spine: Secondary | ICD-10-CM

## 2016-10-27 DIAGNOSIS — R293 Abnormal posture: Secondary | ICD-10-CM

## 2016-10-27 NOTE — Therapy (Signed)
[facility] [address], [place], [place], [address] Phone: [phone]   Fax:  [phone]  Physical Therapy Treatment  Patient Details  Name: William Norris MRN: [id number] Date of Birth: [birth year] Referring Provider: Dr. Betty Martinique  Encounter Date: 10/27/2016      PT End of Session - 10/27/16 1018    Visit Number 4   Number of Visits 30   Date for PT Re-Evaluation 12/13/16   Authorization Type 30 visit limit   Authorization - Visit Number 4   Authorization - Number of Visits 30   PT Start Time 8099   PT Stop Time 1058   PT Time Calculation (min) 43 min   Activity Tolerance Patient tolerated treatment well   Behavior During Therapy [facility] for tasks assessed/performed      Past Medical History:  Diagnosis Date  . Insomnia   . Internal hemorrhoids   . Migraine   . Skull fracture ([place])    no neurologic sequelae  . Torn Achilles tendon    right    Past Surgical History:  Procedure Laterality Date  . KNEE CARTILAGE SURGERY     x2    There were no vitals filed for this visit.      Subjective Assessment - 10/27/16 1023    Subjective Pt states he is feeling good today.  Reports yesterday was a long day at work and he is working on getting a standing desk.  States that exercises last night with trainer helped.   Pertinent History history of neck pain   Limitations Sitting   How long can you sit comfortably? at work 30 min   How long can you walk comfortably? unlimited   Diagnostic tests MRI cervical spine spurring   Patient Stated Goals be with less pain and stretching techniques;  strength muscle groups   Currently in Pain? Yes   Pain Score 3    Pain Location Scapula   Pain Orientation Right   Pain Descriptors / Indicators Aching   Pain Type Chronic pain   Pain Onset More than a month ago   Pain Frequency Constant   Aggravating Factors  work   Pain Relieving Factors exercises, massage   Multiple Pain Sites No                         [facility] Adult PT Treatment/Exercise - 10/27/16 0001      Manual Therapy   Soft tissue mobilization right anterior deltoid, subscapularis; right pectoralis; right rhomboids; right lattissimus; right trap; right levator ani; right thoracic paraspinals          Trigger Point Dry Needling - 10/27/16 1020    Consent Given? Yes   Muscles Treated Upper Body --  deltoids   Levator Scapulae Response Twitch response elicited;Palpable increased muscle length   Rhomboids Response Twitch response elicited;Palpable increased muscle length   Subscapularis Response Twitch response elicited;Palpable increased muscle length                PT Short Term Goals - 10/25/16 0932      PT SHORT TERM GOAL #1   Title The patient will demonstrate improved postural awareness and strategies for self care including use of lumbar roll, standing often   Time 4   Period Weeks   Status On-going     PT SHORT TERM GOAL #2   Title The patient will report a 30% reduction in right scapular pain with  usual ADLs   Time 4   Period Weeks   Status On-going     PT SHORT TERM GOAL #3   Title Left sidebending ROM improved to 30 degrees needed for driving   Time 4   Period Weeks   Status On-going           PT Long Term Goals - 10/18/16 2118      PT LONG TERM GOAL #1   Title The patient will be independent in safe self progression of HEP   Time 8   Period Weeks   Status New   Target Date 12/13/16     PT LONG TERM GOAL #2   Title The patient will have full and painless shoulder ROM needed for reaching overhead   Time 8   Period Weeks   Status New     PT LONG TERM GOAL #3   Title The patient will report a 60% improvement in pain with usual ADLs   Time 8   Period Weeks   Status New     PT LONG TERM GOAL #4   Title Periscapular strength to 5-/5 needed for lifting/carrying objects   Time 8   Period Weeks   Status New     PT LONG  TERM GOAL #5   Title FOTO functional outcome score improved from 39% limitation to 27% indicating improved function with less pain   Time 8   Period Weeks   Status New               Plan - 10/27/16 1105    Clinical Impression Statement Patient responded well to dry needling and manual therapy with reduced pain after treatment.  Tissue lengthening occurred.  Pt continues to need skilled PT for postural strength and improvements in order to prevent muscle spasms   Rehab Potential Good   PT Treatment/Interventions ADLs/Self Care Home Management;Cryotherapy;Electrical Stimulation;Ultrasound;Traction;Moist Heat;Therapeutic activities;Therapeutic exercise;Neuromuscular re-education;Patient/family education;Manual techniques;Taping;Dry needling   PT Next Visit Plan dry needling rhomboids, levator, subscapularis (posterior axillary wall), pectoralis;  scapular mobs, thoracic extension;  progress postural strength   PT Home Exercise Plan progress as needed   Consulted and Agree with Plan of Care Patient      Patient will benefit from skilled therapeutic intervention in order to improve the following deficits and impairments:  Pain, Decreased strength, Increased muscle spasms, Increased fascial restricitons, Impaired UE functional use, Decreased range of motion  Visit Diagnosis: Pain in thoracic spine  Muscle weakness (generalized)  Abnormal posture     Problem List Patient Active Problem List   Diagnosis Date Noted  . Right knee pain 09/03/2015  . Left shoulder pain 12/19/2011  . 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 HAND PAIN, RIGHT 11/02/2009  . RHINITIS 03/20/2009  . MIGRAINE WITHOUT AURA 01/29/2009  . DYSFNCT ASSO W/SLEEP STGES/AROUSAL FRM SLEEP 01/29/2009  . SPRAIN&STRAIN OTHER SPECIFIED SITES KNEE&LEG 04/09/2007  . SORE THROAT 11/08/2006    Zannie Cove, PT 10/27/2016, 11:07 AM  [facility] [address]  [place], [place], [address] Phone: [phone]   Fax:  [phone]  Name: William Norris MRN: [id number] Date of Birth: 1955-10-18

## 2016-11-01 ENCOUNTER — Ambulatory Visit: Payer: Managed Care, Other (non HMO) | Admitting: Physical Therapy

## 2016-11-01 DIAGNOSIS — R293 Abnormal posture: Secondary | ICD-10-CM

## 2016-11-01 DIAGNOSIS — M6281 Muscle weakness (generalized): Secondary | ICD-10-CM

## 2016-11-01 DIAGNOSIS — M546 Pain in thoracic spine: Secondary | ICD-10-CM | POA: Diagnosis not present

## 2016-11-01 NOTE — Therapy (Signed)
[facility] [address], [place], [place], [address] Phone: [phone]   Fax:  [phone]  Physical Therapy Treatment  Patient Details  Name: William Norris MRN: [id number] Date of Birth: [birth year] Referring Provider: Dr. Betty Martinique  Encounter Date: 11/01/2016      PT End of Session - 11/01/16 1315    Visit Number 5   Number of Visits 30   Date for PT Re-Evaluation 12/13/16   Authorization Type 30 visit limit   Authorization - Visit Number 5   Authorization - Number of Visits 30   PT Start Time 4627   PT Stop Time 1320   PT Time Calculation (min) 46 min   Activity Tolerance Patient tolerated treatment well      Past Medical History:  Diagnosis Date  . Insomnia   . Internal hemorrhoids   . Migraine   . Skull fracture ([place])    no neurologic sequelae  . Torn Achilles tendon    right    Past Surgical History:  Procedure Laterality Date  . KNEE CARTILAGE SURGERY     x2    There were no vitals filed for this visit.      Subjective Assessment - 11/01/16 1235    Subjective (P)  Up and down in status.  Today 4/10, yesterday 7/10.  Hard to say if needling is helping.  I ache that day and then better the next day.     Currently in Pain? (P)  Yes   Pain Score (P)  4    Pain Location (P)  Scapula   Pain Orientation (P)  Right   Pain Type (P)  Chronic pain   Aggravating Factors  (P)  work   Pain Relieving Factors (P)  ball pressure                         [facility] Adult PT Treatment/Exercise - 11/01/16 0001      Self-Care   Self-Care Other Self-Care Comments   Other Self-Care Comments  use of Theracane for trigger point release     Shoulder Exercises: Prone   Extension AROM;Right;10 reps   Horizontal ABduction 1 AROM;Right;10 reps   Horizontal ABduction 2 AROM;Right;10 reps     Moist Heat Therapy   Number Minutes Moist Heat 12 Minutes   Moist Heat Location --  right scapula      Electrical Stimulation   Electrical Stimulation Location right scapula   Electrical Stimulation Action IFC   Electrical Stimulation Parameters 9 ma 15 min prone   Electrical Stimulation Goals Pain     Manual Therapy   Soft tissue mobilization right rhomboids, infraspinatus, subscapularis          Trigger Point Dry Needling - 11/01/16 1317    Rhomboids Response Twitch response elicited;Palpable increased muscle length   Infraspinatus Response Twitch response elicited;Palpable increased muscle length   Subscapularis Response Palpable increased muscle length        Right only       PT Education - 11/01/16 1314    Education provided Yes   Education Details I, Y, T prone scapular strengthening;  Theracane and foam roll info   Person(s) Educated Patient   Methods Explanation;Demonstration;Handout   Comprehension Verbalized understanding;Returned demonstration          PT Short Term Goals - 10/25/16 0932      PT SHORT TERM GOAL #1   Title The patient will demonstrate improved  postural awareness and strategies for self care including use of lumbar roll, standing often   Time 4   Period Weeks   Status On-going     PT SHORT TERM GOAL #2   Title The patient will report a 30% reduction in right scapular pain with usual ADLs   Time 4   Period Weeks   Status On-going     PT SHORT TERM GOAL #3   Title Left sidebending ROM improved to 30 degrees needed for driving   Time 4   Period Weeks   Status On-going           PT Long Term Goals - 10/18/16 2118      PT LONG TERM GOAL #1   Title The patient will be independent in safe self progression of HEP   Time 8   Period Weeks   Status New   Target Date 12/13/16     PT LONG TERM GOAL #2   Title The patient will have full and painless shoulder ROM needed for reaching overhead   Time 8   Period Weeks   Status New     PT LONG TERM GOAL #3   Title The patient will report a 60% improvement in pain with usual  ADLs   Time 8   Period Weeks   Status New     PT LONG TERM GOAL #4   Title Periscapular strength to 5-/5 needed for lifting/carrying objects   Time 8   Period Weeks   Status New     PT LONG TERM GOAL #5   Title FOTO functional outcome score improved from 39% limitation to 27% indicating improved function with less pain   Time 8   Period Weeks   Status New               Plan - 11/01/16 1730    Clinical Impression Statement Numerous tender points persist in right rhomboid muscles in particular.  Decreased size and number following DN and manual therapy.  Good relief from Theracane for self trigger point release.  With verbal and tactile cues he is able to activate middle and lower traps with prone scapular exercises without pain.  May need additional DN and other myofascial techniques secondary to the chronicity of the problem.     Rehab Potential Good   PT Frequency 2x / week   PT Duration 8 weeks   PT Treatment/Interventions ADLs/Self Care Home Management;Cryotherapy;Electrical Stimulation;Ultrasound;Traction;Moist Heat;Therapeutic activities;Therapeutic exercise;Neuromuscular re-education;Patient/family education;Manual techniques;Taping;Dry needling   PT Next Visit Plan dry needling rhomboids, levator, subscapularis;   scapular mobs, thoracic extension;  progress postural and scapular strength;  recheck shoulder and cervical ROM       Patient will benefit from skilled therapeutic intervention in order to improve the following deficits and impairments:  Pain, Decreased strength, Increased muscle spasms, Increased fascial restricitons, Impaired UE functional use, Decreased range of motion  Visit Diagnosis: Pain in thoracic spine  Muscle weakness (generalized)  Abnormal posture     Problem List Patient Active Problem List   Diagnosis Date Noted  . Right knee pain 09/03/2015  . Left shoulder pain 12/19/2011  . 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 HAND PAIN, RIGHT  11/02/2009  . RHINITIS 03/20/2009  . MIGRAINE WITHOUT AURA 01/29/2009  . DYSFNCT ASSO W/SLEEP STGES/AROUSAL FRM SLEEP 01/29/2009  . SPRAIN&STRAIN OTHER SPECIFIED SITES KNEE&LEG 04/09/2007  . SORE THROAT 11/08/2006   Ruben Im, PT 11/01/16 5:36 PM Phone: [phone] Fax: [phone]  Ruben Im  C 11/01/2016, 5:35 PM  [facility] [address] [place], [place], [address] Phone: [phone]   Fax:  [phone]  Name: LINK BURGESON MRN: [id number] Date of Birth: 1955/11/15

## 2016-11-01 NOTE — Patient Instructions (Signed)
         Theracane (for trigger point release)   OPTP foam roll Pro-Roller Soft    Online at [employer] or [employer]   Ruben Im PT [facility] [address], [place], [place] [address] Phone # [phone] Fax [phone]

## 2016-11-08 ENCOUNTER — Other Ambulatory Visit: Payer: Self-pay | Admitting: Family Medicine

## 2016-11-08 ENCOUNTER — Ambulatory Visit: Payer: Managed Care, Other (non HMO) | Admitting: Physical Therapy

## 2016-11-08 DIAGNOSIS — R293 Abnormal posture: Secondary | ICD-10-CM

## 2016-11-08 DIAGNOSIS — M549 Dorsalgia, unspecified: Secondary | ICD-10-CM

## 2016-11-08 DIAGNOSIS — M546 Pain in thoracic spine: Secondary | ICD-10-CM | POA: Diagnosis not present

## 2016-11-08 DIAGNOSIS — M6281 Muscle weakness (generalized): Secondary | ICD-10-CM

## 2016-11-08 NOTE — Therapy (Signed)
[facility] [address], [place], [place], [address] Phone: [phone]   Fax:  [phone]  Physical Therapy Treatment  Patient Details  Name: William Norris MRN: [id number] Date of Birth: 02-Jul-1955 Referring Provider: Dr. Betty Martinique  Encounter Date: 11/08/2016      PT End of Session - 11/08/16 0807    Visit Number 6   Number of Visits 30   Date for PT Re-Evaluation 12/13/16   Authorization Type 30 visit limit   Authorization - Visit Number 6   Authorization - Number of Visits 30   PT Start Time 0802   PT Stop Time 0851   PT Time Calculation (min) 49 min   Activity Tolerance Patient tolerated treatment well   Behavior During Therapy [facility] for tasks assessed/performed      Past Medical History:  Diagnosis Date  . Insomnia   . Internal hemorrhoids   . Migraine   . Skull fracture ([place])    no neurologic sequelae  . Torn Achilles tendon    right    Past Surgical History:  Procedure Laterality Date  . KNEE CARTILAGE SURGERY     x2    There were no vitals filed for this visit.      Subjective Assessment - 11/08/16 0803    Subjective I worked out 3x last week and that didn't aggravate anything.  I am feeling much better only had a couple of spikes.   Pertinent History history of neck pain   Limitations Sitting   Currently in Pain? Yes   Pain Score 2    Pain Location Scapula   Pain Orientation Right   Pain Descriptors / Indicators Aching   Pain Onset More than a month ago   Pain Frequency Constant   Aggravating Factors  work   Pain Relieving Factors exercise and massage   Multiple Pain Sites No                         [facility] Adult PT Treatment/Exercise - 11/08/16 0001      Neck Exercises: Machines for Strengthening   UBE (Upper Arm Bike) L1 3x3  had some increased soreness +1/10, cues for posture     Moist Heat Therapy   Number Minutes Moist Heat 15 Minutes      Electrical Stimulation   Electrical Stimulation Location right scapula   Electrical Stimulation Action IFC   Electrical Stimulation Parameters 15 min to tolerance   Electrical Stimulation Goals Pain     Manual Therapy   Soft tissue mobilization right rhomboids, infraspinatus, subscapularis, levator          Trigger Point Dry Needling - 11/08/16 0835    Muscles Treated Upper Body Pectoralis major   Pectoralis Major Response --  did not respond to needle   Levator Scapulae Response Twitch response elicited;Palpable increased muscle length   Rhomboids Response Twitch response elicited;Palpable increased muscle length   Infraspinatus Response Twitch response elicited;Palpable increased muscle length   Subscapularis Response Twitch response elicited;Palpable increased muscle length                PT Short Term Goals - 10/25/16 0932      PT SHORT TERM GOAL #1   Title The patient will demonstrate improved postural awareness and strategies for self care including use of lumbar roll, standing often   Time 4   Period Weeks   Status On-going     PT SHORT  TERM GOAL #2   Title The patient will report a 30% reduction in right scapular pain with usual ADLs   Time 4   Period Weeks   Status On-going     PT SHORT TERM GOAL #3   Title Left sidebending ROM improved to 30 degrees needed for driving   Time 4   Period Weeks   Status On-going           PT Long Term Goals - 10/18/16 2118      PT LONG TERM GOAL #1   Title The patient will be independent in safe self progression of HEP   Time 8   Period Weeks   Status New   Target Date 12/13/16     PT LONG TERM GOAL #2   Title The patient will have full and painless shoulder ROM needed for reaching overhead   Time 8   Period Weeks   Status New     PT LONG TERM GOAL #3   Title The patient will report a 60% improvement in pain with usual ADLs   Time 8   Period Weeks   Status New     PT LONG TERM GOAL #4   Title  Periscapular strength to 5-/5 needed for lifting/carrying objects   Time 8   Period Weeks   Status New     PT LONG TERM GOAL #5   Title FOTO functional outcome score improved from 39% limitation to 27% indicating improved function with less pain   Time 8   Period Weeks   Status New               Plan - 11/08/16 0807    Clinical Impression Statement Patient had less tender and adhesive tissue after dry needling and manual treatment.  Patient demonstrates good posture with UBE but did have some very slight increased soreness with arm bike  Pt will benefit from skilled PT to lengthen muscle for improved joint mobility and functoinal activities without increased pain.   Rehab Potential Good   PT Treatment/Interventions ADLs/Self Care Home Management;Cryotherapy;Electrical Stimulation;Ultrasound;Traction;Moist Heat;Therapeutic activities;Therapeutic exercise;Neuromuscular re-education;Patient/family education;Manual techniques;Taping;Dry needling   PT Next Visit Plan dry needling rhomboids, levator, subscapularis;   scapular mobs, thoracic extension;  progress postural and scapular strength;  recheck shoulder and cervical ROM       Patient will benefit from skilled therapeutic intervention in order to improve the following deficits and impairments:  Pain, Decreased strength, Increased muscle spasms, Increased fascial restricitons, Impaired UE functional use, Decreased range of motion  Visit Diagnosis: Pain in thoracic spine  Muscle weakness (generalized)  Abnormal posture     Problem List Patient Active Problem List   Diagnosis Date Noted  . Right knee pain 09/03/2015  . Left shoulder pain 12/19/2011  . 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 HAND PAIN, RIGHT 11/02/2009  . RHINITIS 03/20/2009  . MIGRAINE WITHOUT AURA 01/29/2009  . DYSFNCT ASSO W/SLEEP STGES/AROUSAL FRM SLEEP 01/29/2009  . SPRAIN&STRAIN OTHER SPECIFIED SITES KNEE&LEG 04/09/2007  . SORE THROAT 11/08/2006     Zannie Cove, PT 11/08/2016, 8:45 AM  [facility] [address], [place], [place], [address] Phone: [phone]   Fax:  [phone]  Name: William Norris MRN: [id number] Date of Birth: [birth year]

## 2016-11-11 ENCOUNTER — Ambulatory Visit: Payer: Managed Care, Other (non HMO) | Attending: Family Medicine | Admitting: Physical Therapy

## 2016-11-11 DIAGNOSIS — M546 Pain in thoracic spine: Secondary | ICD-10-CM | POA: Diagnosis not present

## 2016-11-11 DIAGNOSIS — R293 Abnormal posture: Secondary | ICD-10-CM | POA: Diagnosis present

## 2016-11-11 DIAGNOSIS — M6281 Muscle weakness (generalized): Secondary | ICD-10-CM | POA: Insufficient documentation

## 2016-11-11 NOTE — Therapy (Signed)
[facility] [address] [place], [place], [address] Phone: [phone]   Fax:  [phone]  Physical Therapy Treatment  Patient Details  Name: William Norris MRN: [id number] Date of Birth: 1955/11/05 Referring Provider: Dr. Betty Martinique  Encounter Date: 11/11/2016      PT End of Session - 11/11/16 0755    Visit Number 7   Number of Visits 30   Date for PT Re-Evaluation 12/13/16   Authorization Type 30 visit limit   Authorization - Visit Number 7   Authorization - Number of Visits 30   PT Start Time 8341   PT Stop Time 0846   PT Time Calculation (min) 49 min   Activity Tolerance Patient tolerated treatment well   Behavior During Therapy [facility] for tasks assessed/performed      Past Medical History:  Diagnosis Date  . Insomnia   . Internal hemorrhoids   . Migraine   . Skull fracture ([place])    no neurologic sequelae  . Torn Achilles tendon    right    Past Surgical History:  Procedure Laterality Date  . KNEE CARTILAGE SURGERY     x2    There were no vitals filed for this visit.      Subjective Assessment - 11/11/16 0801    Subjective It is feeling better, not as loud.  It is more background pain.  This is the best week and a half I have had in a long time.     Pertinent History history of neck pain   Limitations Sitting   How long can you sit comfortably? at work 30 min   Patient Stated Goals be with less pain and stretching techniques;  strength muscle groups   Currently in Pain? Yes   Pain Score 2    Pain Location Scapula   Pain Orientation Right   Pain Descriptors / Indicators Aching   Pain Type Chronic pain   Pain Onset More than a month ago   Pain Frequency Constant   Aggravating Factors  work   Pain Relieving Factors exercise and massage   Multiple Pain Sites No            [facility] PT Assessment - 11/11/16 0001      AROM   Cervical Flexion 63   Cervical Extension 56   Cervical -  Right Side Bend 25   Cervical - Left Side Bend 28   Cervical - Right Rotation 40   Cervical - Left Rotation 35                     [facility] Adult PT Treatment/Exercise - 11/11/16 0001      Neck Exercises: Supine   Other Supine Exercise lying on foam roller, horizontal abduction and bilaterl ER red band - 20x each   Other Supine Exercise pec stretch lying on foam roll     Shoulder Exercises: Supine   Other Supine Exercises lying on foam roll horizontal for thoracic extension - 10x 5 sec hold   Other Supine Exercises serratus punches right UE - 4lb - 30x     Shoulder Exercises: Standing   Extension Strengthening;Both;20 reps;Theraband  2 sests   Theraband Level (Shoulder Extension) Level 2 (Red)   Row Strengthening;20 reps;Theraband  2 sets   Theraband Level (Shoulder Row) Level 3 (Green)     Moist Heat Therapy   Number Minutes Moist Heat 5 Minutes   Moist Heat Location Shoulder  scapula in  prone     Manual Therapy   Soft tissue mobilization right rhomboids, infraspinatus, subscapularis, levator          Trigger Point Dry Needling - 11/11/16 0826    Consent Given? Yes   Rhomboids Response Twitch response elicited;Palpable increased muscle length   Infraspinatus Response Twitch response elicited;Palpable increased muscle length   Subscapularis Response Twitch response elicited;Palpable increased muscle length                PT Short Term Goals - 11/11/16 0813      PT SHORT TERM GOAL #1   Title The patient will demonstrate improved postural awareness and strategies for self care including use of lumbar roll, standing often   Baseline will start using lumbar and has been up and going for more walks   Time 4   Period Weeks   Status Achieved     PT SHORT TERM GOAL #2   Title The patient will report a 30% reduction in right scapular pain with usual ADLs   Baseline more than 30%   Time 4   Period Weeks   Status Achieved     PT SHORT TERM GOAL #3    Title Left sidebending ROM improved to 30 degrees needed for driving   Time 4   Period Weeks           PT Long Term Goals - 10/18/16 2118      PT LONG TERM GOAL #1   Title The patient will be independent in safe self progression of HEP   Time 8   Period Weeks   Status New   Target Date 12/13/16     PT LONG TERM GOAL #2   Title The patient will have full and painless shoulder ROM needed for reaching overhead   Time 8   Period Weeks   Status New     PT LONG TERM GOAL #3   Title The patient will report a 60% improvement in pain with usual ADLs   Time 8   Period Weeks   Status New     PT LONG TERM GOAL #4   Title Periscapular strength to 5-/5 needed for lifting/carrying objects   Time 8   Period Weeks   Status New     PT LONG TERM GOAL #5   Title FOTO functional outcome score improved from 39% limitation to 27% indicating improved function with less pain   Time 8   Period Weeks   Status New               Plan - 11/11/16 0803    Clinical Impression Statement Patient is doing well and has had the best week in a long time.  Reduced pain is lasting and he has achieved short term goal related to pain.  Cervical ROM was rechecked and has not changed at this time.  Pt was able to demonstrate controlled scapular movements during exercises with tactile cues from foam roll and verbal cues during standing exercises.  Pt continues to benefit from dry needle and manual techniques with decreased muscle spasms and lengthening of tissue after treatment.    Rehab Potential Good   PT Treatment/Interventions ADLs/Self Care Home Management;Cryotherapy;Electrical Stimulation;Ultrasound;Traction;Moist Heat;Therapeutic activities;Therapeutic exercise;Neuromuscular re-education;Patient/family education;Manual techniques;Taping;Dry needling   PT Next Visit Plan dry needling rhomboids, levator, subscapularis;   scapular mobs, thoracic extension;  progress postural and scapular strength;   recheck shoulder and cervical ROM    Consulted and Agree with Plan of Care  Patient      Patient will benefit from skilled therapeutic intervention in order to improve the following deficits and impairments:  Pain, Decreased strength, Increased muscle spasms, Increased fascial restricitons, Impaired UE functional use, Decreased range of motion  Visit Diagnosis: Pain in thoracic spine  Muscle weakness (generalized)  Abnormal posture     Problem List Patient Active Problem List   Diagnosis Date Noted  . Right knee pain 09/03/2015  . Left shoulder pain 12/19/2011  . 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 HAND PAIN, RIGHT 11/02/2009  . RHINITIS 03/20/2009  . MIGRAINE WITHOUT AURA 01/29/2009  . DYSFNCT ASSO W/SLEEP STGES/AROUSAL FRM SLEEP 01/29/2009  . SPRAIN&STRAIN OTHER SPECIFIED SITES KNEE&LEG 04/09/2007  . SORE THROAT 11/08/2006    Zannie Cove, PT 11/11/2016, 8:52 AM  [facility] [address], [place], [place], [address] Phone: [phone]   Fax:  [phone]  Name: DENVER BENTSON MRN: [id number] Date of Birth: 02-23-1955

## 2016-11-15 ENCOUNTER — Encounter: Payer: Managed Care, Other (non HMO) | Admitting: Physical Therapy

## 2016-11-18 ENCOUNTER — Encounter: Payer: Self-pay | Admitting: Physical Therapy

## 2016-11-18 ENCOUNTER — Ambulatory Visit: Payer: Managed Care, Other (non HMO) | Admitting: Physical Therapy

## 2016-11-18 DIAGNOSIS — R293 Abnormal posture: Secondary | ICD-10-CM

## 2016-11-18 DIAGNOSIS — M546 Pain in thoracic spine: Secondary | ICD-10-CM

## 2016-11-18 DIAGNOSIS — M6281 Muscle weakness (generalized): Secondary | ICD-10-CM

## 2016-11-18 NOTE — Therapy (Signed)
[facility] [address], [place], [place], [address] Phone: [phone]   Fax:  [phone]  Physical Therapy Treatment  Patient Details  Name: William Norris MRN: [id number] Date of Birth: 1955-10-26 Referring Provider: Dr. Betty Martinique   Encounter Date: 11/18/2016  PT End of Session - 11/18/16 0848    Visit Number  8    Number of Visits  30    Date for PT Re-Evaluation  12/13/16    Authorization Type  30 visit limit    Authorization - Visit Number  8    Authorization - Number of Visits  30    PT Start Time  0846    PT Stop Time  0931    PT Time Calculation (min)  45 min    Activity Tolerance  Patient tolerated treatment well    Behavior During Therapy  [facility] for tasks assessed/performed       Past Medical History:  Diagnosis Date  . Insomnia   . Internal hemorrhoids   . Migraine   . Skull fracture ([place])    no neurologic sequelae  . Torn Achilles tendon    right    Past Surgical History:  Procedure Laterality Date  . KNEE CARTILAGE SURGERY     x2    There were no vitals filed for this visit.  Subjective Assessment - 11/18/16 0850    Subjective  I have been doing the exercises but not as much I as I should.  I was down to about no pain for 3 days    Pertinent History  history of neck pain    Limitations  Sitting    How long can you sit comfortably?  at work 30 min    How long can you walk comfortably?  unlimited    Diagnostic tests  MRI cervical spine spurring    Patient Stated Goals  be with less pain and stretching techniques;  strength muscle groups    Currently in Pain?  Yes    Pain Score  3     Pain Location  Shoulder    Pain Orientation  Right    Pain Type  Chronic pain    Pain Onset  More than a month ago    Pain Frequency  Constant    Aggravating Factors   work    Pain Relieving Factors  exercise, needling, massage    Multiple Pain Sites  No                      [facility]  Adult PT Treatment/Exercise - 11/18/16 0001      Neuro Re-ed    Neuro Re-ed Details   cues for scap stability and opening chest during exercises      Shoulder Exercises: Sidelying   External Rotation  Strengthening;Right;20 reps;Weights    External Rotation Weight (lbs)  3      Shoulder Exercises: Standing   External Rotation  Strengthening;Right;20 reps;Theraband    Theraband Level (Shoulder External Rotation)  Level 2 (Red)    Extension  Strengthening;Both;20 reps;Theraband 2 sests    Theraband Level (Shoulder Extension)  Level 3 (Green)    Row  [employer];Theraband 2 sets    Theraband Level (Shoulder Row)  Level 3 (Green)    Other Standing Exercises  ER at 45 deg abduction - red band - 20x      Moist Heat Therapy   Number Minutes Moist Heat  15 Minutes  Moist Heat Location  Shoulder      Electrical Stimulation   Electrical Stimulation Location  right scapula    Electrical Stimulation Action  IFC    Electrical Stimulation Parameters  15 min to tolerance    Electrical Stimulation Goals  Pain      Manual Therapy   Soft tissue mobilization  right rhomboids, infraspinatus, subscapularis, levator       Trigger Point Dry Needling - 11/18/16 0857    Consent Given?  Yes    Rhomboids Response  Twitch response elicited;Palpable increased muscle length    Infraspinatus Response  Twitch response elicited;Palpable increased muscle length    Subscapularis Response  Twitch response elicited;Palpable increased muscle length           PT Education - 11/18/16 0917    Education provided  Yes    Education Details  side lying ER    Person(s) Educated  Patient    Methods  Explanation;Demonstration;Verbal cues;Handout    Comprehension  Verbalized understanding;Returned demonstration       PT Short Term Goals - 11/11/16 0813      PT SHORT TERM GOAL #1   Title  The patient will demonstrate improved postural awareness and strategies for self care including use of lumbar  roll, standing often    Baseline  will start using lumbar and has been up and going for more walks    Time  4    Period  Weeks    Status  Achieved      PT SHORT TERM GOAL #2   Title  The patient will report a 30% reduction in right scapular pain with usual ADLs    Baseline  more than 30%    Time  4    Period  Weeks    Status  Achieved      PT SHORT TERM GOAL #3   Title  Left sidebending ROM improved to 30 degrees needed for driving    Time  4    Period  Weeks        PT Long Term Goals - 11/18/16 0254      PT LONG TERM GOAL #1   Title  The patient will be independent in safe self progression of HEP    Time  8    Period  Weeks    Status  On-going      PT LONG TERM GOAL #2   Title  The patient will have full and painless shoulder ROM needed for reaching overhead    Time  8    Status  On-going      PT LONG TERM GOAL #3   Title  The patient will report a 60% improvement in pain with usual ADLs    Baseline  no pain for several days after treatment    Time  8    Period  Weeks    Status  On-going      PT LONG TERM GOAL #4   Title  Periscapular strength to 5-/5 needed for lifting/carrying objects    Time  8    Period  Weeks    Status  On-going      PT LONG TERM GOAL #5   Title  FOTO functional outcome score improved from 39% limitation to 27% indicating improved function with less pain    Time  8    Period  Weeks    Status  On-going            Plan -  11/18/16 0919    Clinical Impression Statement  Patient continues to have longer periods of time that are pain free.  He did well with exercises and demonstrates fatigue with exercises reporting muscle soreness.  Pt will benefit from skilled PT to continue to work on shoulder strength and stability and improved muscle length.    Rehab Potential  Good    PT Treatment/Interventions  ADLs/Self Care Home Management;Cryotherapy;Electrical Stimulation;Ultrasound;Traction;Moist Heat;Therapeutic activities;Therapeutic  exercise;Neuromuscular re-education;Patient/family education;Manual techniques;Taping;Dry needling    PT Next Visit Plan  dry needle, scap and RTC strengthen progressing as tolerated; shoulder and cervical ROM next    PT Home Exercise Plan  progress as needed    Consulted and Agree with Plan of Care  Patient       Patient will benefit from skilled therapeutic intervention in order to improve the following deficits and impairments:  Pain, Decreased strength, Increased muscle spasms, Increased fascial restricitons, Impaired UE functional use, Decreased range of motion  Visit Diagnosis: Pain in thoracic spine  Muscle weakness (generalized)  Abnormal posture     Problem List Patient Active Problem List   Diagnosis Date Noted  . Right knee pain 09/03/2015  . Left shoulder pain 12/19/2011  . 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 HAND PAIN, RIGHT 11/02/2009  . RHINITIS 03/20/2009  . MIGRAINE WITHOUT AURA 01/29/2009  . DYSFNCT ASSO W/SLEEP STGES/AROUSAL FRM SLEEP 01/29/2009  . SPRAIN&STRAIN OTHER SPECIFIED SITES KNEE&LEG 04/09/2007  . SORE THROAT 11/08/2006    Zannie Cove, PT 11/18/2016, 9:32 AM  [facility] [address], [place], [place], [address] Phone: [phone]   Fax:  [phone]  Name: William Norris MRN: [id number] Date of Birth: 1955-07-13

## 2016-11-18 NOTE — Patient Instructions (Signed)
   FREE WEIGHT - EXTERNAL ROTATION - ER  Lie on your side and hold a weight with your elbow bent and rested on your side. Next, draw up the your arm from the ground towards the ceiling. 3 lb do 2-3 sets of Declo [address] [place], [place] [address] Phone # [phone] Fax [phone]

## 2016-11-22 ENCOUNTER — Ambulatory Visit: Payer: Managed Care, Other (non HMO) | Admitting: Physical Therapy

## 2016-11-22 DIAGNOSIS — M546 Pain in thoracic spine: Secondary | ICD-10-CM | POA: Diagnosis not present

## 2016-11-22 DIAGNOSIS — R293 Abnormal posture: Secondary | ICD-10-CM

## 2016-11-22 DIAGNOSIS — M6281 Muscle weakness (generalized): Secondary | ICD-10-CM

## 2016-11-22 NOTE — Therapy (Signed)
[facility] [address], [place], [place], [address] Phone: [phone]   Fax:  [phone]  Physical Therapy Treatment  Patient Details  Name: William Norris MRN: [id number] Date of Birth: 1955/02/06 Referring Provider: Dr. Betty Martinique   Encounter Date: 11/22/2016  PT End of Session - 11/22/16 0841    Visit Number  9    Number of Visits  30    Date for PT Re-Evaluation  12/13/16    Authorization Type  30 visit limit    Authorization - Number of Visits  30    PT Start Time  0800    PT Stop Time  0848    PT Time Calculation (min)  48 min    Activity Tolerance  Patient tolerated treatment well       Past Medical History:  Diagnosis Date  . Insomnia   . Internal hemorrhoids   . Migraine   . Skull fracture ([place])    no neurologic sequelae  . Torn Achilles tendon    right    Past Surgical History:  Procedure Laterality Date  . KNEE CARTILAGE SURGERY     x2    There were no vitals filed for this visit.  Subjective Assessment - 11/22/16 0758    Subjective  Pretty good.  Only 1 bad day last week.  The weekend and Monday were good.  Rode my mountain bike on Sunday with no ill effects.   I spend less time thinking about it.      Currently in Pain?  Yes    Pain Score  3     Pain Location  Scapula inferior border of scapula    Pain Orientation  Right    Pain Type  Chronic pain    Aggravating Factors   work    Pain Relieving Factors  exercise, needling, massage         [facility] PT Assessment - 11/22/16 0001      AROM   Cervical Flexion  68    Cervical Extension  58    Cervical - Right Side Bend  40    Cervical - Left Side Bend  45    Cervical - Right Rotation  40    Cervical - Left Rotation  45                  [facility] Adult PT Treatment/Exercise - 11/22/16 0001      Neck Exercises: Prone   Other Prone Exercise  quadruped UE raises 10x    Other Prone Exercise  quadruped UE/LE raises 10x  each      Shoulder Exercises: Standing   External Rotation  Strengthening;Right;20 reps;Theraband foot prop on BOSU    Theraband Level (Shoulder External Rotation)  Level 2 (Red)    Extension  Strengthening;Both;20 reps;Theraband foot prop on BOSU    Theraband Level (Shoulder Extension)  Level 3 (Green)    Row  [employer];Theraband foot prop on BOSU    Theraband Level (Shoulder Row)  Level 3 (Green)      Moist Heat Therapy   Number Minutes Moist Heat  15 Minutes    Moist Heat Location  Shoulder      Electrical Stimulation   Electrical Stimulation Location  right scapula    Electrical Stimulation Action  IFC    Electrical Stimulation Parameters  15 min 9 ma    Electrical Stimulation Goals  Pain      Manual Therapy   Soft  tissue mobilization  right rhomboids, infraspinatus, subscapularis, levator       Trigger Point Dry Needling - 11/22/16 0840    Consent Given?  Yes    Infraspinatus Response  Palpable increased muscle length    Subscapularis Response  Twitch response elicited;Palpable increased muscle length axilla approach and posterior approach             PT Short Term Goals - 11/22/16 0843      PT SHORT TERM GOAL #1   Title  The patient will demonstrate improved postural awareness and strategies for self care including use of lumbar roll, standing often    Status  Achieved      PT SHORT TERM GOAL #2   Title  The patient will report a 30% reduction in right scapular pain with usual ADLs    Status  Achieved      PT SHORT TERM GOAL #3   Title  Left sidebending ROM improved to 30 degrees needed for driving    Status  Achieved        PT Long Term Goals - 11/18/16 7824      PT LONG TERM GOAL #1   Title  The patient will be independent in safe self progression of HEP    Time  8    Period  Weeks    Status  On-going      PT LONG TERM GOAL #2   Title  The patient will have full and painless shoulder ROM needed for reaching overhead    Time  8     Status  On-going      PT LONG TERM GOAL #3   Title  The patient will report a 60% improvement in pain with usual ADLs    Baseline  no pain for several days after treatment    Time  8    Period  Weeks    Status  On-going      PT LONG TERM GOAL #4   Title  Periscapular strength to 5-/5 needed for lifting/carrying objects    Time  8    Period  Weeks    Status  On-going      PT LONG TERM GOAL #5   Title  FOTO functional outcome score improved from 39% limitation to 27% indicating improved function with less pain    Time  8    Period  Weeks    Status  On-going            Plan - 11/22/16 2353    Clinical Impression Statement  Improved cervical ROM especially with left sidebending and left rotation.  Also notable improvement in scapular mobility.  Decreased tender point size and numbers.  Pain level decreasing and function improving.      Rehab Potential  Good    PT Frequency  2x / week    PT Treatment/Interventions  ADLs/Self Care Home Management;Cryotherapy;Electrical Stimulation;Ultrasound;Traction;Moist Heat;Therapeutic activities;Therapeutic exercise;Neuromuscular re-education;Patient/family education;Manual techniques;Taping;Dry needling    PT Next Visit Plan  dry needle, scap and RTC strengthen progressing as tolerated       Patient will benefit from skilled therapeutic intervention in order to improve the following deficits and impairments:  Pain, Decreased strength, Increased muscle spasms, Increased fascial restricitons, Impaired UE functional use, Decreased range of motion  Visit Diagnosis: Pain in thoracic spine  Muscle weakness (generalized)  Abnormal posture     Problem List Patient Active Problem List   Diagnosis Date Noted  . Right knee pain 09/03/2015  .  Left shoulder pain 12/19/2011  . 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 HAND PAIN, RIGHT 11/02/2009  . RHINITIS 03/20/2009  . MIGRAINE WITHOUT AURA 01/29/2009  . DYSFNCT ASSO W/SLEEP STGES/AROUSAL  FRM SLEEP 01/29/2009  . SPRAIN&STRAIN OTHER SPECIFIED SITES KNEE&LEG 04/09/2007  . SORE THROAT 11/08/2006   Ruben Im, PT 11/22/16 10:30 AM Phone: [phone] Fax: [phone]  Alvera Singh 11/22/2016, 10:30 AM  [facility] [address], [place], [place], [address] Phone: [phone]   Fax:  [phone]  Name: William Norris MRN: [id number] Date of Birth: Jun 08, 1955

## 2016-11-25 ENCOUNTER — Ambulatory Visit: Payer: Managed Care, Other (non HMO) | Admitting: Physical Therapy

## 2016-11-25 ENCOUNTER — Encounter: Payer: Self-pay | Admitting: Physical Therapy

## 2016-11-25 DIAGNOSIS — R293 Abnormal posture: Secondary | ICD-10-CM

## 2016-11-25 DIAGNOSIS — M546 Pain in thoracic spine: Secondary | ICD-10-CM | POA: Diagnosis not present

## 2016-11-25 DIAGNOSIS — M6281 Muscle weakness (generalized): Secondary | ICD-10-CM

## 2016-11-25 NOTE — Therapy (Signed)
[facility] [address] [place], [place], [address] Phone: [phone]   Fax:  [phone]  Physical Therapy Treatment  Patient Details  Name: William Norris MRN: [id number] Date of Birth: 08-29-1955 Referring Provider: Dr. Betty Martinique   Encounter Date: 11/25/2016  PT End of Session - 11/25/16 0800    Visit Number  10    Number of Visits  30    Date for PT Re-Evaluation  12/13/16    Authorization Type  30 visit limit    Authorization - Visit Number  10    Authorization - Number of Visits  30    PT Start Time  0800    PT Stop Time  0840    PT Time Calculation (min)  40 min    Activity Tolerance  Patient tolerated treatment well    Behavior During Therapy  [facility] for tasks assessed/performed       Past Medical History:  Diagnosis Date  . Insomnia   . Internal hemorrhoids   . Migraine   . Skull fracture ([place])    no neurologic sequelae  . Torn Achilles tendon    right    Past Surgical History:  Procedure Laterality Date  . KNEE CARTILAGE SURGERY     x2    There were no vitals filed for this visit.  Subjective Assessment - 11/25/16 0929    Subjective  I haven't been noticing my shoulder this week.  I am noticing it a little this morning but not really pain.    Pertinent History  history of neck pain    Limitations  Sitting    How long can you sit comfortably?  at work 30 min    How long can you walk comfortably?  unlimited    Diagnostic tests  MRI cervical spine spurring    Patient Stated Goals  be with less pain and stretching techniques;  strength muscle groups    Currently in Pain?  No/denies                      [facility] Adult PT Treatment/Exercise - 11/25/16 0001      Neck Exercises: Seated   Other Seated Exercise  rocabodo 6x6 educated and performed 1 set each      Shoulder Exercises: Standing   Horizontal ABduction  Strengthening;Both;20 reps;Theraband against foam roller with  cervical retraction    Theraband Level (Shoulder Horizontal ABduction)  Level 2 (Red)    External Rotation  Strengthening;Right;20 reps;Theraband against foam roller with cervical retraction    Theraband Level (Shoulder External Rotation)  Level 3 (Green)      Shoulder Exercises: ROM/Strengthening   UBE (Upper Arm Bike)  L2 3x 3 PT present to discuss treatment and progress       Manual Therapy   Soft tissue mobilization  right rhomboids, infraspinatus, subscapularis, levator       Trigger Point Dry Needling - 11/25/16 0844    Consent Given?  Yes    Rhomboids Response  Twitch response elicited;Palpable increased muscle length    Infraspinatus Response  Twitch response elicited;Palpable increased muscle length    Subscapularis Response  Twitch response elicited;Palpable increased muscle length           PT Education - 11/25/16 0826    Education provided  Yes    Education Details  rocabado 6x 6    Person(s) Educated  Patient    Methods  Explanation;Demonstration;Handout    Comprehension  Verbalized understanding;Returned demonstration       PT Short Term Goals - 11/22/16 0843      PT SHORT TERM GOAL #1   Title  The patient will demonstrate improved postural awareness and strategies for self care including use of lumbar roll, standing often    Status  Achieved      PT SHORT TERM GOAL #2   Title  The patient will report a 30% reduction in right scapular pain with usual ADLs    Status  Achieved      PT SHORT TERM GOAL #3   Title  Left sidebending ROM improved to 30 degrees needed for driving    Status  Achieved        PT Long Term Goals - 11/25/16 0928      PT LONG TERM GOAL #5   Title  FOTO functional outcome score improved from 39% limitation to 27% indicating improved function with less pain    Baseline  14% 11/25/16    Time  8    Period  Weeks    Status  Achieved            Plan - 11/25/16 0845    Clinical Impression Statement  FOTO has improved to  14% limited.  Patient has improved scapular ROM and able to lift full medial boarder away from the ribcage.  Pt reports less pain this week than he has felt since starting PT.  Pt continues to have some difficulty activating rhomboids and had increased tightness with UBE.  Able to relax again after manual techniques. He continues to demonstrate progress and will benefit from skilled PT to work on improved postural strength.    PT Treatment/Interventions  ADLs/Self Care Home Management;Cryotherapy;Electrical Stimulation;Ultrasound;Traction;Moist Heat;Therapeutic activities;Therapeutic exercise;Neuromuscular re-education;Patient/family education;Manual techniques;Taping;Dry needling    PT Next Visit Plan  and RTC strengthen progressing as tolerated,  dry needle as needed       Patient will benefit from skilled therapeutic intervention in order to improve the following deficits and impairments:  Pain, Decreased strength, Increased muscle spasms, Increased fascial restricitons, Impaired UE functional use, Decreased range of motion  Visit Diagnosis: Pain in thoracic spine  Muscle weakness (generalized)  Abnormal posture     Problem List Patient Active Problem List   Diagnosis Date Noted  . Right knee pain 09/03/2015  . Left shoulder pain 12/19/2011  . 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 HAND PAIN, RIGHT 11/02/2009  . RHINITIS 03/20/2009  . MIGRAINE WITHOUT AURA 01/29/2009  . DYSFNCT ASSO W/SLEEP STGES/AROUSAL FRM SLEEP 01/29/2009  . SPRAIN&STRAIN OTHER SPECIFIED SITES KNEE&LEG 04/09/2007  . SORE THROAT 11/08/2006    Zannie Cove, PT 11/25/2016, 9:29 AM  [facility] [address] [place], [place], [address] Phone: [phone]   Fax:  [phone]  Name: William Norris MRN: [id number] Date of Birth: 07/09/1955

## 2016-11-25 NOTE — Patient Instructions (Signed)
Rocabado 6x6 (6 exercises, 6 reps, 6x/day)  1. Tongue clicking: make a clicking sound position with the tongue against the hard palate in the the correct resting position for appropriate nasal and diaphragmatic breathing.  Once the activity is practiced, attempt to maintain appropriate tongue/jaw resting position throughout normal activity  2. Controlled TMJ rotation on opening: Maintain tongue on the hard palate while opening and closing the jaw.  Use this range of motion during chewing to control hypermobility.  3. Mandibular rhythmic stabilization: Apply resistance to opening, closing, and lateral deviation with the jaw in a resting position.  Promotes good positioning and stability of the jaw during normal activities.  4. Upper cervical distraction: Perform upper cervical flexion while stabilizing the cervical spine with a 'hand-collar'.  Relieves compression in the upper cervical spine.  5. Axial extension of cervical spine: Perform cervical retraction, then bring head back for lower cervical extension.  6. Shoulder girdle retraction: Perform retraction and depression of the scapulae in relation to the ribcage.

## 2016-11-29 ENCOUNTER — Encounter: Payer: Managed Care, Other (non HMO) | Admitting: Physical Therapy

## 2016-12-06 ENCOUNTER — Encounter: Payer: Self-pay | Admitting: Physical Therapy

## 2016-12-06 ENCOUNTER — Ambulatory Visit: Payer: Managed Care, Other (non HMO) | Admitting: Physical Therapy

## 2016-12-06 DIAGNOSIS — M6281 Muscle weakness (generalized): Secondary | ICD-10-CM

## 2016-12-06 DIAGNOSIS — M546 Pain in thoracic spine: Secondary | ICD-10-CM | POA: Diagnosis not present

## 2016-12-06 DIAGNOSIS — R293 Abnormal posture: Secondary | ICD-10-CM

## 2016-12-06 NOTE — Therapy (Signed)
[facility] [address], [place], [place], [address] Phone: [phone]   Fax:  [phone]  Physical Therapy Treatment  Patient Details  Name: William Norris MRN: [id number] Date of Birth: 02/12/1955 Referring Provider: Dr. Betty Martinique   Encounter Date: 12/06/2016  PT End of Session - 12/06/16 0915    Visit Number  11    Number of Visits  30    Date for PT Re-Evaluation  12/13/16    Authorization Type  30 visit limit    Authorization - Visit Number  11    Authorization - Number of Visits  30    PT Start Time  6269    PT Stop Time  0925    PT Time Calculation (min)  44 min    Activity Tolerance  Patient tolerated treatment well       Past Medical History:  Diagnosis Date  . Insomnia   . Internal hemorrhoids   . Migraine   . Skull fracture ([place])    no neurologic sequelae  . Torn Achilles tendon    right    Past Surgical History:  Procedure Laterality Date  . KNEE CARTILAGE SURGERY     x2    There were no vitals filed for this visit.  Subjective Assessment - 12/06/16 0845    Subjective  My shoulder has been doing well.  It's been a lot better.  workThere were a few days when I didn't notice it.  It's achy which I attribute to work.  I didn't walk as much yesterday during work.    Currently in Pain?  Yes    Pain Score  2     Pain Location  Scapula    Pain Orientation  Right    Aggravating Factors   ergonomics at work    Pain Relieving Factors  exercise, needling, massage                      [facility] Adult PT Treatment/Exercise - 12/06/16 0001      Shoulder Exercises: Standing   Other Standing Exercises  wax on /off on wall 2x10    Other Standing Exercises  red band wall walks and drivers 48N each      Shoulder Exercises: ROM/Strengthening   Other ROM/Strengthening Exercises  UE Ranger L15 straight and biased directions    Other ROM/Strengthening Exercises  red band horizontal  abduction and scaption facing wall 10x each      Moist Heat Therapy   Number Minutes Moist Heat  10 Minutes    Moist Heat Location  Shoulder      Electrical Stimulation   Electrical Stimulation Location  right scapula    Electrical Stimulation Action  IFC    Electrical Stimulation Parameters  10 min 7 ma    Electrical Stimulation Goals  Pain      Manual Therapy   Soft tissue mobilization  right rhomboids, infraspinatus, subscapularis, levator       Trigger Point Dry Needling - 12/06/16 0914    Rhomboids Response  Twitch response elicited;Palpable increased muscle length    Infraspinatus Response  Twitch response elicited;Palpable increased muscle length    Subscapularis Response  Twitch response elicited;Palpable increased muscle length       Right only      PT Short Term Goals - 11/22/16 0843      PT SHORT TERM GOAL #1   Title  The patient will demonstrate improved postural awareness  and strategies for self care including use of lumbar roll, standing often    Status  Achieved      PT SHORT TERM GOAL #2   Title  The patient will report a 30% reduction in right scapular pain with usual ADLs    Status  Achieved      PT SHORT TERM GOAL #3   Title  Left sidebending ROM improved to 30 degrees needed for driving    Status  Achieved        PT Long Term Goals - 12/06/16 0925      PT LONG TERM GOAL #1   Title  The patient will be independent in safe self progression of HEP    Time  8    Period  Weeks    Status  On-going      PT LONG TERM GOAL #2   Title  The patient will have full and painless shoulder ROM needed for reaching overhead    Time  8    Period  Weeks    Status  On-going      PT LONG TERM GOAL #3   Title  The patient will report a 60% improvement in pain with usual ADLs    Time  8    Period  Weeks    Status  On-going      PT LONG TERM GOAL #4   Title  Periscapular strength to 5-/5 needed for lifting/carrying objects    Time  8    Period  Weeks     Status  On-going      PT LONG TERM GOAL #5   Title  FOTO functional outcome score improved from 39% limitation to 27% indicating improved function with less pain    Status  Achieved            Plan - 12/06/16 0916    Clinical Impression Statement  The patient reports his daily pain has improved a lot over the past 2 weeks.  He reports the band exercises are very fatiguing to his shoulder blade muscles.  He has also been doing some shoulder exercises with his personal trainer for shoulder strengthening.  Improved soft tissue mobility around the scapula.  Anticipate he will meet remaining goals in next 2-3 visits and will be independent with a home maintenance program.  He states he plans to get a home TENs for Christmas.      Rehab Potential  Good    PT Frequency  2x / week    PT Duration  8 weeks    PT Treatment/Interventions  ADLs/Self Care Home Management;Cryotherapy;Electrical Stimulation;Ultrasound;Traction;Moist Heat;Therapeutic activities;Therapeutic exercise;Neuromuscular re-education;Patient/family education;Manual techniques;Taping;Dry needling    PT Next Visit Plan  scapular and rotator cuff strengthening;  manual as needed;  electrical stimulation/heat as needed       Patient will benefit from skilled therapeutic intervention in order to improve the following deficits and impairments:  Pain, Decreased strength, Increased muscle spasms, Increased fascial restricitons, Impaired UE functional use, Decreased range of motion  Visit Diagnosis: Pain in thoracic spine  Muscle weakness (generalized)  Abnormal posture     Problem List Patient Active Problem List   Diagnosis Date Noted  . Right knee pain 09/03/2015  . Left shoulder pain 12/19/2011  . 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 HAND PAIN, RIGHT 11/02/2009  . RHINITIS 03/20/2009  . MIGRAINE WITHOUT AURA 01/29/2009  . DYSFNCT ASSO W/SLEEP STGES/AROUSAL FRM SLEEP 01/29/2009  . SPRAIN&STRAIN OTHER SPECIFIED  SITES KNEE&LEG 04/09/2007  .  SORE THROAT 11/08/2006   Ruben Im, PT 12/06/16 11:20 AM Phone: [phone] Fax: [phone]  Alvera Singh 12/06/2016, 11:20 AM  [facility] [address] [place], [place], [address] Phone: [phone]   Fax:  [phone]  Name: William Norris MRN: [id number] Date of Birth: Sep 06, 1955

## 2016-12-09 ENCOUNTER — Ambulatory Visit: Payer: Managed Care, Other (non HMO) | Admitting: Physical Therapy

## 2016-12-09 ENCOUNTER — Encounter: Payer: Self-pay | Admitting: Physical Therapy

## 2016-12-09 DIAGNOSIS — M546 Pain in thoracic spine: Secondary | ICD-10-CM | POA: Diagnosis not present

## 2016-12-09 DIAGNOSIS — M6281 Muscle weakness (generalized): Secondary | ICD-10-CM

## 2016-12-09 DIAGNOSIS — R293 Abnormal posture: Secondary | ICD-10-CM

## 2016-12-09 NOTE — Therapy (Addendum)
[facility] [address] [place], [place], [address] Phone: [phone]   Fax:  [phone]  Physical Therapy Treatment/Discharge Summary  Patient Details  Name: William Norris MRN: [id number] Date of Birth: Sep 11, 1955 Referring Provider: Dr. Betty Martinique   Encounter Date: 12/09/2016  PT End of Session - 12/09/16 0931    Visit Number  12    Number of Visits  30    Date for PT Re-Evaluation  12/13/16    Authorization Type  30 visit limit    Authorization - Visit Number  12    Authorization - Number of Visits  30    PT Start Time  0845    PT Stop Time  0925    PT Time Calculation (min)  40 min    Activity Tolerance  Patient tolerated treatment well    Behavior During Therapy  [facility] for tasks assessed/performed       Past Medical History:  Diagnosis Date  . Insomnia   . Internal hemorrhoids   . Migraine   . Skull fracture ([place])    no neurologic sequelae  . Torn Achilles tendon    right    Past Surgical History:  Procedure Laterality Date  . KNEE CARTILAGE SURGERY     x2    There were no vitals filed for this visit.  Subjective Assessment - 12/09/16 0850    Subjective  I feel alot better.  The dry needling helped.     Limitations  Sitting    How long can you sit comfortably?  at work 30 min    How long can you walk comfortably?  unlimited    Diagnostic tests  MRI cervical spine spurring    Patient Stated Goals  be with less pain and stretching techniques;  strength muscle groups    Currently in Pain?  Yes    Pain Score  1     Pain Location  Scapula    Pain Orientation  Right    Pain Descriptors / Indicators  Aching    Pain Type  Chronic pain    Pain Onset  More than a month ago    Pain Frequency  Intermittent    Aggravating Factors   ergonomics at work    Pain Relieving Factors  exercise, needling, massage    Multiple Pain Sites  No                      [facility] Adult PT  Treatment/Exercise - 12/09/16 0001      Neuro Re-ed    Neuro Re-ed Details   sitting bringing scapula together equally with same amount of contraction , hold 3 seconds 10x      Shoulder Exercises: ROM/Strengthening   UBE (Upper Arm Bike)  L2 3x 3 PT present to discuss treatment and progress       Modalities   Modalities  Electrical engineer Stimulation Location  right rhomboid    Electrical Stimulation Action  e-stim II, mA    Electrical Stimulation Parameters  5 min, frequency 80 ma, intensity to patient tolerance    Electrical Stimulation Goals  Tone      Manual Therapy   Manual Therapy  Soft tissue mobilization    Soft tissue mobilization  right rhomboids, infraspinatus, subscapularis, levator       Trigger Point Dry Needling - 12/09/16 3338    Consent Given?  Yes  Muscles Treated Upper Body  Rhomboids;Infraspinatus middle tra    Rhomboids Response  Twitch response elicited;Palpable increased muscle length right    Infraspinatus Response  Twitch response elicited;Palpable increased muscle length right             PT Short Term Goals - 11/22/16 0843      PT SHORT TERM GOAL #1   Title  The patient will demonstrate improved postural awareness and strategies for self care including use of lumbar roll, standing often    Status  Achieved      PT SHORT TERM GOAL #2   Title  The patient will report a 30% reduction in right scapular pain with usual ADLs    Status  Achieved      PT SHORT TERM GOAL #3   Title  Left sidebending ROM improved to 30 degrees needed for driving    Status  Achieved        PT Long Term Goals - 12/09/16 8676      PT LONG TERM GOAL #1   Title  The patient will be independent in safe self progression of HEP    Baseline  still learing    Time  8    Period  Weeks    Status  On-going      PT LONG TERM GOAL #2   Title  The patient will have full and painless shoulder ROM needed for reaching  overhead    Time  8    Period  Weeks    Status  On-going      PT LONG TERM GOAL #3   Title  The patient will report a 60% improvement in pain with usual ADLs    Baseline  no pain for several days after treatment    Time  8    Status  On-going      PT LONG TERM GOAL #4   Title  Periscapular strength to 5-/5 needed for lifting/carrying objects    Time  8    Period  Weeks    Status  On-going            Plan - 12/09/16 0932    Clinical Impression Statement  Patient had no pain or achiness in right scapula area.  Patient has more difficulty with retraction of the right scapula compared to the left.  Patient had trigger points in the right middle and lower trap, right rhomboids, and infraspinatus with reduction after dry needling with electrical stimulation.  Patient will need one more visit for HEP and dry needling.  Patient plans on getting a Home Tens Unit.     Rehab Potential  Good    PT Frequency  2x / week    PT Duration  8 weeks    PT Treatment/Interventions  ADLs/Self Care Home Management;Cryotherapy;Electrical Stimulation;Ultrasound;Traction;Moist Heat;Therapeutic activities;Therapeutic exercise;Neuromuscular re-education;Patient/family education;Manual techniques;Taping;Dry needling    PT Next Visit Plan  scapular and rotator cuff strengthening;  manual as needed;  electrical stimulation/heat as needed; possible discharge next visit Talk to [facility] about Home Tens unit. She is asking the rep.     PT Home Exercise Plan  progress as needed    Consulted and Agree with Plan of Care  Patient         PHYSICAL THERAPY DISCHARGE SUMMARY  Visits from Start of Care: 12  Current functional level related to goals / functional outcomes: Patient called to cancel his last scheduled appt and requesting discharge stating he was feeling better.  Will  discharge at his request with partial goals met.     Remaining deficits: As above  Education / Equipment: Comprehensive  HEP Plan: Patient agrees to discharge.  Patient goals were partially met. Patient is being discharged due to meeting the stated rehab goals.  ?????         Patient will benefit from skilled therapeutic intervention in order to improve the following deficits and impairments:  Pain, Decreased strength, Increased muscle spasms, Increased fascial restricitons, Impaired UE functional use, Decreased range of motion  Visit Diagnosis: Pain in thoracic spine  Muscle weakness (generalized)  Abnormal posture     Problem List Patient Active Problem List   Diagnosis Date Noted  . Right knee pain 09/03/2015  . Left shoulder pain 12/19/2011  . 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 HAND PAIN, RIGHT 11/02/2009  . RHINITIS 03/20/2009  . MIGRAINE WITHOUT AURA 01/29/2009  . DYSFNCT ASSO W/SLEEP STGES/AROUSAL FRM SLEEP 01/29/2009  . SPRAIN&STRAIN OTHER SPECIFIED SITES KNEE&LEG 04/09/2007  . SORE THROAT 11/08/2006   Ruben Im, PT 12/20/16 11:03 AM Phone: [phone] Fax: [phone]  Earlie Counts, PT 12/09/16 9:39 AM   [facility] [address] [place], [place], [address] Phone: [phone]   Fax:  [phone]  Name: William Norris MRN: [id number] Date of Birth: 1955/04/10

## 2016-12-13 ENCOUNTER — Ambulatory Visit: Payer: Self-pay | Admitting: Physical Therapy

## 2016-12-16 ENCOUNTER — Other Ambulatory Visit: Payer: Self-pay | Admitting: Family Medicine

## 2016-12-23 ENCOUNTER — Other Ambulatory Visit: Payer: Self-pay | Admitting: Family Medicine

## 2017-01-05 ENCOUNTER — Other Ambulatory Visit: Payer: Self-pay | Admitting: Family Medicine

## 2017-03-18 ENCOUNTER — Encounter: Payer: Self-pay | Admitting: Gastroenterology

## 2017-06-05 ENCOUNTER — Other Ambulatory Visit: Payer: Self-pay | Admitting: Family Medicine

## 2017-06-05 DIAGNOSIS — M25511 Pain in right shoulder: Secondary | ICD-10-CM

## 2017-06-06 NOTE — Telephone Encounter (Signed)
Dr. Todd patient  

## 2017-09-20 ENCOUNTER — Ambulatory Visit (INDEPENDENT_AMBULATORY_CARE_PROVIDER_SITE_OTHER): Payer: Managed Care, Other (non HMO) | Admitting: Family Medicine

## 2017-09-20 ENCOUNTER — Encounter: Payer: Self-pay | Admitting: Family Medicine

## 2017-09-20 VITALS — BP 104/60 | HR 62 | Temp 97.8°F | Ht 66.5 in | Wt 130.1 lb

## 2017-09-20 DIAGNOSIS — Z23 Encounter for immunization: Secondary | ICD-10-CM

## 2017-09-20 DIAGNOSIS — G4720 Circadian rhythm sleep disorder, unspecified type: Secondary | ICD-10-CM

## 2017-09-20 DIAGNOSIS — Z0000 Encounter for general adult medical examination without abnormal findings: Secondary | ICD-10-CM

## 2017-09-20 DIAGNOSIS — Z125 Encounter for screening for malignant neoplasm of prostate: Secondary | ICD-10-CM | POA: Diagnosis not present

## 2017-09-20 DIAGNOSIS — G43009 Migraine without aura, not intractable, without status migrainosus: Secondary | ICD-10-CM

## 2017-09-20 LAB — POCT URINALYSIS DIPSTICK
Bilirubin, UA: NEGATIVE
Blood, UA: NEGATIVE
Glucose, UA: NEGATIVE
KETONES UA: NEGATIVE
LEUKOCYTES UA: NEGATIVE
NITRITE UA: NEGATIVE
PROTEIN UA: NEGATIVE
Spec Grav, UA: 1.02 (ref 1.010–1.025)
UROBILINOGEN UA: 0.2 U/dL
pH, UA: 6 (ref 5.0–8.0)

## 2017-09-20 LAB — CBC WITH DIFFERENTIAL/PLATELET
Basophils Absolute: 0.1 10*3/uL (ref 0.0–0.1)
Basophils Relative: 1.6 % (ref 0.0–3.0)
EOS ABS: 0 10*3/uL (ref 0.0–0.7)
Eosinophils Relative: 1 % (ref 0.0–5.0)
HEMATOCRIT: 41.1 % (ref 39.0–52.0)
HEMOGLOBIN: 14 g/dL (ref 13.0–17.0)
LYMPHS PCT: 26.7 % (ref 12.0–46.0)
Lymphs Abs: 1 10*3/uL (ref 0.7–4.0)
MCHC: 34 g/dL (ref 30.0–36.0)
MCV: 88.4 fl (ref 78.0–100.0)
MONOS PCT: 12.4 % — AB (ref 3.0–12.0)
Monocytes Absolute: 0.4 10*3/uL (ref 0.1–1.0)
NEUTROS ABS: 2.1 10*3/uL (ref 1.4–7.7)
Neutrophils Relative %: 58.3 % (ref 43.0–77.0)
PLATELETS: 139 10*3/uL — AB (ref 150.0–400.0)
RBC: 4.65 Mil/uL (ref 4.22–5.81)
RDW: 13.6 % (ref 11.5–15.5)
WBC: 3.6 10*3/uL — AB (ref 4.0–10.5)

## 2017-09-20 LAB — LIPID PANEL
CHOLESTEROL: 162 mg/dL (ref 0–200)
HDL: 56.2 mg/dL (ref >39.00)
LDL CALC: 94 mg/dL (ref 0–99)
NonHDL: 106.01
Total CHOL/HDL Ratio: 3
Triglycerides: 61 mg/dL (ref 0.0–149.0)
VLDL: 12.2 mg/dL (ref 0.0–40.0)

## 2017-09-20 LAB — TSH: TSH: 2.34 u[IU]/mL (ref 0.35–4.50)

## 2017-09-20 LAB — BASIC METABOLIC PANEL
BUN: 21 mg/dL (ref 6–23)
CO2: 33 mEq/L — ABNORMAL HIGH (ref 19–32)
Calcium: 9.3 mg/dL (ref 8.4–10.5)
Chloride: 101 mEq/L (ref 96–112)
Creatinine, Ser: 1.03 mg/dL (ref 0.40–1.50)
GFR: 78.06 mL/min (ref >60.00)
Glucose, Bld: 79 mg/dL (ref 70–99)
POTASSIUM: 4.4 meq/L (ref 3.5–5.1)
SODIUM: 139 meq/L (ref 135–145)

## 2017-09-20 LAB — PSA: PSA: 0.31 ng/mL (ref 0.10–4.00)

## 2017-09-20 LAB — HEPATIC FUNCTION PANEL
ALT: 19 U/L (ref 0–53)
AST: 25 U/L (ref 0–37)
Albumin: 4.3 g/dL (ref 3.5–5.2)
Alkaline Phosphatase: 30 U/L — ABNORMAL LOW (ref 39–117)
BILIRUBIN DIRECT: 0.1 mg/dL (ref 0.0–0.3)
Total Bilirubin: 0.5 mg/dL (ref 0.2–1.2)
Total Protein: 6.5 g/dL (ref 6.0–8.3)

## 2017-09-20 MED ORDER — ZOLMITRIPTAN 5 MG PO TBDP: ORAL_TABLET | ORAL | 1 refills | Status: DC

## 2017-09-20 MED ORDER — TRAZODONE HCL 100 MG PO TABS: 100.0000 mg | ORAL_TABLET | Freq: Every day | ORAL | 4 refills | Status: DC

## 2017-09-20 NOTE — Progress Notes (Signed)
William Norris is a delightful 62 year old married male nonsmoker who comes in today for general physical examination a cousin the following issues  For the past couple years he's had decreasing sleep. We started him on trazodone 100 mg daily which helps however he still has sleep dysfunction and would like to pursue it. He tells me that his wife tells him that he snores a lot. He'll wake up 3 or 4 times at night can go back to sleep. He only has nocturia 1. He says now his tongue is real dry and fissured because he mouth breathes.  He's 41 and half-inch is tall weight is 1:30 therefore not obese. Has a normal neck and normal airway.  He also has difficulty with cleaning his rectum after bowel movements. He's had some external hemorrhoids banded 2 by GI. He had a flexible sigmoidoscopy in 2017 was normal except for the hemorrhoids. He's due for totalcolonoscopy.  He has a history of tendinitis over the past year. It's constant. He has a history of noise exposure in the past although no trauma to his tympanic membranes.  He gets routine eye care, dental care, colonoscopy as noted above  Vaccinations............. Seasonal flu shot tetanus given today information given on the shingles vaccine.  14 point review of systems reviewed and otherwise negative  . 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 He works out on a daily basis.  BP 104/60 (BP Location: Left Arm, Patient Position: Sitting, Cuff Size: Normal)   Pulse 62   Temp 97.8 F (36.6 C) (Oral)   Ht 5' 6.5" (1.689 m)   Wt 130 lb 1 oz (59 kg)   SpO2 98%   BMI 20.68 kg/m  Well-developed well-nourished male no acute distress vital signs stable he is afebrile HEENT were negative in particular his tongue was normal tonsils are nonenlarged he appears to have a normal airway. Neck was supple thyroid  was not enlarged no carotid bruits. Cardiopulmonary exam normal abdominal exam normal. Genitalia normal circumcised male rectum 2 small external hemorrhoids. DRE normal prostate normal stool guaiac-negative  Extremities normal skin normal peripheral pulses normal  #1 healthy male  #2 sleep dysfunction........ Consult to rule out sleep apnea despite not being obese  #3 external hemorrhoids.........Marland Kitchen Discussed treatment options........ Trial of steroid cream daily at bedtime  Number for migraine headaches............Marland Kitchen Markedly decreased over time another refill Zomig

## 2017-09-20 NOTE — Patient Instructions (Signed)
Increase the trazodone to 150 mg at bedtime  Labs today........... I will call you if there is anything abnormal We'll get you set up for consult and pulmonary to evaluate the possibility of sleep apnea  Continue your exercise program  Follow-up in one year sooner if any problems  Call your insurance company following get the new shingles vaccine  Call GI get set up for follow-up colonoscopy.  Apply small amounts of cortisone cream OTC to the hemorrhoids daily at bedtime

## 2017-10-13 ENCOUNTER — Encounter: Payer: Self-pay | Admitting: Gastroenterology

## 2017-11-08 ENCOUNTER — Ambulatory Visit (INDEPENDENT_AMBULATORY_CARE_PROVIDER_SITE_OTHER): Payer: Managed Care, Other (non HMO) | Admitting: Pulmonary Disease

## 2017-11-08 ENCOUNTER — Encounter: Payer: Self-pay | Admitting: Pulmonary Disease

## 2017-11-08 VITALS — BP 118/68 | HR 67 | Ht 66.5 in | Wt 135.0 lb

## 2017-11-08 DIAGNOSIS — R0683 Snoring: Secondary | ICD-10-CM

## 2017-11-08 DIAGNOSIS — G4700 Insomnia, unspecified: Secondary | ICD-10-CM

## 2017-11-08 NOTE — Patient Instructions (Signed)
Will arrange for home sleep study Will call to arrange for follow up after sleep study reviewed  

## 2017-11-08 NOTE — Progress Notes (Signed)
[facility]  Chief Complaint  Patient presents with  . Sleep Consult    Referred by Dr. Sherren Mocha for issues with staying asleep at night. He will wake up around 2am and not be able to fall back asleep.     Constitutional:  BP 118/68 (BP Location: Left Arm, Patient Position: Sitting, Cuff Size: Normal)   Pulse 67   Ht 5' 6.5" (1.689 m)   Wt 135 lb (61.2 kg)   SpO2 99%   BMI 21.46 kg/m   Past Medical History:  Hemorrhoids, Migraine HA, Insomnia  Brief Summary:  William Norris is a 62 y.o. male with insomnia and snoring.  He has noticed trouble with his sleep for years.  He used to have trouble falling asleep.  He was using OTC sleep aides.  Changed to trazodone about 1.5 yrs ago.  Now using 150 mg trazodone nightly.  This helps him fall asleep.  He still has trouble staying asleep.  He wakes up every 2 to 3 hours to use the bathroom.  He then has trouble falling back to sleep.  He will read on his IPAD.  He goes to bed at 10 pm and takes trazodone before going to bed.  He falls asleep in 10 minutes.  He snores at night.  He gets out of bed at 6 am.  He is under a lot of stress related to family issues.  He doesn't feel like he is depressed or anxious otherwise.  He feels okay in the morning.  He gets tired later in the day.  He doesn't use anything to help him stay awake.  He gets cramps several times a week during the night.  He denies sleep walking, sleep talking, bruxism, or nightmares.  There is no history of restless legs.  He denies sleep hallucinations, sleep paralysis, or cataplexy.  The Epworth score is 6 out of 24.     Physical Exam:   Appearance - well kempt   ENMT - clear nasal mucosa, mildly deviated nasal  septum, no oral exudates, no LAN, trachea midline, MP 3, over bite, enlarged tongue  Respiratory - normal chest wall, normal respiratory effort, no accessory muscle use, no wheeze/rales  CV - s1s2 regular rate and rhythm, no  murmurs, no peripheral edema, radial pulses symmetric  GI - soft, non tender, no masses  Lymph - no adenopathy noted in neck and axillary areas  MSK - normal gait  Ext - no cyanosis, clubbing, or joint inflammation noted  Skin - no rashes, lesions, or ulcers  Neuro - normal strength, oriented x 3  Psych - normal mood and affect  Discussion:  He has history of insomnia.  Trazodone seems to have helped him fall asleep.  He is still having trouble staying asleep.  He snores, has frequent nocturia, and nocturnal leg cramps.  I am concerned his trouble staying asleep might actually be related to sleep apnea.  Assessment/Plan:   Snoring with excessive daytime sleepiness. - will need to arrange for a home sleep study  Cardiovascular risk. - had an extensive discussion regarding the adverse health consequences related to untreated sleep disordered breathing - specifically discussed the risks for hypertension, coronary artery disease, cardiac dysrhythmias, cerebrovascular disease, and diabetes - lifestyle modification discussed  Safe driving practices. - discussed how sleep disruption can increase risk of accidents, particularly when driving - safe driving practices were discussed  Therapies for obstructive sleep apnea. - if the sleep study shows significant  sleep apnea, then various therapies for treatment were reviewed: CPAP, oral appliance, and surgical interventions  Insomnia. - continue trazodone qhs    Patient Instructions  Will arrange for home sleep study Will call to arrange for follow up after sleep study reviewed     Chesley Mires, MD [place] Pager: [phone] 11/08/2017, 5:19 PM  Flow Sheet    Sleep tests:    Review of Systems:  Reviewed and negative except in HPI.  Medications:   Allergies as of 11/08/2017   No Known Allergies     Medication List        Accurate as of 11/08/17  5:19 PM. Always use your most recent med  list.          ANTIOXIDANT PO Take 1 capsule by mouth daily.   traZODone 100 MG tablet Commonly known as:  DESYREL Take 1 tablet (100 mg total) by mouth at bedtime. 1-1/2 tablets daily at bedtime   zolmitriptan 5 MG disintegrating tablet Commonly known as:  ZOMIG-ZMT TAKE 1 TABLET BY MOUTH AS DIRECTED IF NEEDED FOR MIGRAINES       Past Surgical History:  He  has a past surgical history that includes Knee cartilage surgery.  Family History:  His family history includes Colon cancer in his unknown relative; Glaucoma in his brother; Irritable bowel syndrome in his mother; Osteoporosis in his mother.  Social History:  He  reports that he has never smoked. He has never used smokeless tobacco. He reports that he drinks alcohol. He reports that he does not use drugs.

## 2017-11-14 ENCOUNTER — Encounter: Payer: Self-pay | Admitting: Gastroenterology

## 2017-11-14 ENCOUNTER — Ambulatory Visit (AMBULATORY_SURGERY_CENTER): Payer: Self-pay | Admitting: *Deleted

## 2017-11-14 VITALS — Ht 67.0 in | Wt 135.4 lb

## 2017-11-14 DIAGNOSIS — Z1211 Encounter for screening for malignant neoplasm of colon: Secondary | ICD-10-CM

## 2017-11-14 MED ORDER — NA SULFATE-K SULFATE-MG SULF 17.5-3.13-1.6 GM/177ML PO SOLN: 1.0000 | Freq: Once | ORAL | 0 refills | Status: AC

## 2017-11-14 NOTE — Progress Notes (Signed)
No egg or soy allergy known to patient  issues with past sedation with any surgeries  or procedures of PONV , no intubation problems  No diet pills per patient No home 02 use per patient  No blood thinners per patient  Pt denies issues with constipation  No A fib or A flutter  EMMI video sent to pt's e mail - pt declined  Suprep $15 coupon to pt

## 2017-11-28 ENCOUNTER — Encounter: Payer: Self-pay | Admitting: Gastroenterology

## 2017-11-28 ENCOUNTER — Ambulatory Visit (AMBULATORY_SURGERY_CENTER): Payer: Managed Care, Other (non HMO) | Admitting: Gastroenterology

## 2017-11-28 VITALS — BP 106/74 | HR 65 | Temp 98.7°F | Resp 17 | Ht 67.0 in | Wt 135.0 lb

## 2017-11-28 DIAGNOSIS — Z1211 Encounter for screening for malignant neoplasm of colon: Secondary | ICD-10-CM

## 2017-11-28 DIAGNOSIS — D120 Benign neoplasm of cecum: Secondary | ICD-10-CM

## 2017-11-28 DIAGNOSIS — K635 Polyp of colon: Secondary | ICD-10-CM

## 2017-11-28 MED ORDER — SODIUM CHLORIDE 0.9 % IV SOLN: 500.0000 mL | Freq: Once | INTRAVENOUS | Status: DC

## 2017-11-28 NOTE — Op Note (Addendum)
[facility] Patient Name: William Norris Procedure Date: 11/28/2017 8:27 AM MRN: [id number] Endoscopist: Remo Lipps P. Havery Moros , MD Age: 62 Referring MD:  Date of Birth: 03-01-1955 Gender: Male Account #: [id number] Procedure:                Colonoscopy Indications:              Screening for colorectal malignant neoplasm,                            patient incidentally with fecal leakage s/p banding                            of hemorrhoids in the past Medicines:                Monitored Anesthesia Care Procedure:                Pre-Anesthesia Assessment:                           - Prior to the procedure, a History and Physical                            was performed, and patient medications and                            allergies were reviewed. The patient's tolerance of                            previous anesthesia was also reviewed. The risks                            and benefits of the procedure and the sedation                            options and risks were discussed with the patient.                            All questions were answered, and informed consent                            was obtained. Prior Anticoagulants: The patient has                            taken no previous anticoagulant or antiplatelet                            agents. ASA Grade Assessment: I - A normal, healthy                            patient. After reviewing the risks and benefits,                            the patient was deemed in satisfactory condition to  undergo the procedure.                           After obtaining informed consent, the colonoscope                            was passed under direct vision. Throughout the                            procedure, the patient's blood pressure, pulse, and                            oxygen saturations were monitored continuously. The                            Colonoscope was introduced through the anus  and                            advanced to the the cecum, identified by                            appendiceal orifice and ileocecal valve. The                            colonoscopy was performed without difficulty. The                            patient tolerated the procedure well. The quality                            of the bowel preparation was good. The ileocecal                            valve, appendiceal orifice, and rectum were                            photographed. Scope In: 8:34:25 AM Scope Out: 4:74:25 AM Scope Withdrawal Time: 0 hours 16 minutes 59 seconds  Total Procedure Duration: 0 hours 21 minutes 52 seconds  Findings:                 The perianal and digital rectal examinations were                            normal.                           A 3 mm polyp was found in the cecum. The polyp was                            sessile. The polyp was removed with a cold snare.                            Resection and retrieval were complete.  Many medium-mouthed diverticula were found in the                            sigmoid colon.                           Anal papilla(e) were hypertrophied. Scarring from                            prior hemorrhoid banding noted.                           The exam was otherwise without abnormality. Complications:            No immediate complications. Estimated blood loss:                            Minimal. Estimated Blood Loss:     Estimated blood loss was minimal. Impression:               - One 3 mm polyp in the cecum, removed with a cold                            snare. Resected and retrieved.                           - Diverticulosis in the sigmoid colon.                           - Anal papilla(e) were hypertrophied.                           - Scarring from prior hemorrhoid banding noted.                           - The examination was otherwise normal. Recommendation:           - Patient has a  contact number available for                            emergencies. The signs and symptoms of potential                            delayed complications were discussed with the                            patient. Return to normal activities tomorrow.                            Written discharge instructions were provided to the                            patient.                           - Resume previous diet.                           -  Continue present medications.                           - Await pathology results.                           - Daily fiber supplement for leakage symptoms. If                            symptoms persist despite that, contact me for                            reassessment. Remo Lipps P. Henryk Ursin, MD 11/28/2017 9:00:01 AM This report has been signed electronically.

## 2017-11-28 NOTE — Progress Notes (Signed)
No problems noted in the recovery room. maw 

## 2017-11-28 NOTE — Progress Notes (Signed)
Called to room to assist during endoscopic procedure.  Patient ID and intended procedure confirmed with present staff. Received instructions for my participation in the procedure from the performing physician.  

## 2017-11-28 NOTE — Progress Notes (Signed)
Report given to PACU, vss 

## 2017-11-28 NOTE — Patient Instructions (Signed)
YOU HAD AN ENDOSCOPIC PROCEDURE TODAY AT [facility]:   Refer to the procedure report that was given to you for any specific questions about what was found during the examination.  If the procedure report does not answer your questions, please call your gastroenterologist to clarify.  If you requested that your care partner not be given the details of your procedure findings, then the procedure report has been included in a sealed envelope for you to review at your convenience later.  YOU SHOULD EXPECT: Some feelings of bloating in the abdomen. Passage of more gas than usual.  Walking can help get rid of the air that was put into your GI tract during the procedure and reduce the bloating. If you had a lower endoscopy (such as a colonoscopy or flexible sigmoidoscopy) you may notice spotting of blood in your stool or on the toilet paper. If you underwent a bowel prep for your procedure, you may not have a normal bowel movement for a few days.  Please Note:  You might notice some irritation and congestion in your nose or some drainage.  This is from the oxygen used during your procedure.  There is no need for concern and it should clear up in a day or so.  SYMPTOMS TO REPORT IMMEDIATELY:   Following lower endoscopy (colonoscopy or flexible sigmoidoscopy):  Excessive amounts of blood in the stool  Significant tenderness or worsening of abdominal pains  Swelling of the abdomen that is new, acute  Fever of 100F or higher   For urgent or emergent issues, a gastroenterologist can be reached at any hour by calling [phone].   DIET:  We do recommend a small meal at first, but then you may proceed to your regular diet.  Drink plenty of fluids but you should avoid alcoholic beverages for 24 hours.  ACTIVITY:  You should plan to take it easy for the rest of today and you should NOT DRIVE or use heavy machinery until tomorrow (because of the sedation medicines used during the test).     FOLLOW UP: Our staff will call the number listed on your records the next business day following your procedure to check on you and address any questions or concerns that you may have regarding the information given to you following your procedure. If we do not reach you, we will leave a message.  However, if you are feeling well and you are not experiencing any problems, there is no need to return our call.  We will assume that you have returned to your regular daily activities without incident.  If any biopsies were taken you will be contacted by phone or by letter within the next 1-3 weeks.  Please call us at [phone] if you have not heard about the biopsies in 3 weeks.    SIGNATURES/CONFIDENTIALITY: You and/or your care partner have signed paperwork which will be entered into your electronic medical record.  These signatures attest to the fact that that the information above on your After Visit Summary has been reviewed and is understood.  Full responsibility of the confidentiality of this discharge information lies with you and/or your care-partner.   .Handouts were given to your care partner for Polyps and Diverticulosis. Per Dr. Havery Moros add daily fiber supplement for leakage symptoms.  If symptoms persist that, contact me for reassessment. You may resume your current medications today. Await biopsy results. Please call if any questions or concerns.

## 2017-11-28 NOTE — Progress Notes (Signed)
Pt's states no medical or surgical changes since previsit or office visit. 

## 2017-11-29 ENCOUNTER — Telehealth: Payer: Self-pay | Admitting: *Deleted

## 2017-11-29 ENCOUNTER — Telehealth: Payer: Self-pay

## 2017-11-29 NOTE — Telephone Encounter (Signed)
No answer, left message to call if questions or concerns. 

## 2017-11-29 NOTE — Telephone Encounter (Signed)
No answer, second call, left message to call if questions or concerns.

## 2017-11-29 NOTE — Telephone Encounter (Signed)
Pt will have to find a new PCP if insurance does require a referral. Will notify pt of update when he returns call.

## 2017-11-29 NOTE — Telephone Encounter (Signed)
Copied from [place] [id number]. Topic: Referral - Request for Referral >> Nov 28, 2017  4:42 PM Yvette Rack wrote: Has patient seen PCP for this complaint? no *If NO, is insurance requiring patient see PCP for this issue before PCP can refer them? Pt states he will contact insurance to make sure he does not have to see pcp prior to referral Referral for which specialty: Pt requests referral to Behavior counselor Preferred provider/office: no specific  provider Reason for referral: personal

## 2017-12-04 ENCOUNTER — Encounter: Payer: Self-pay | Admitting: Gastroenterology

## 2017-12-19 DIAGNOSIS — G4733 Obstructive sleep apnea (adult) (pediatric): Secondary | ICD-10-CM

## 2017-12-26 ENCOUNTER — Telehealth: Payer: Self-pay | Admitting: Pulmonary Disease

## 2017-12-26 ENCOUNTER — Encounter: Payer: Self-pay | Admitting: Pulmonary Disease

## 2017-12-26 DIAGNOSIS — G4733 Obstructive sleep apnea (adult) (pediatric): Secondary | ICD-10-CM | POA: Diagnosis not present

## 2017-12-26 HISTORY — DX: Obstructive sleep apnea (adult) (pediatric): G47.33

## 2017-12-26 NOTE — Telephone Encounter (Signed)
HST 12/19/17 >> AHI 9.3, SpO2 low 82%.   Please let him know that the sleep study showed mild obstructive sleep apnea.  Please arrange for ROV with a nurse practitioner to review treatment options.

## 2017-12-28 NOTE — Telephone Encounter (Signed)
Attempted to call patient today regarding results. I did not receive an answer at time of call. I have left a voicemail message for pt to return call. X1  

## 2017-12-29 NOTE — Telephone Encounter (Signed)
Attempted to call patient today regarding results. I did not receive an answer at time of call. I have left a voicemail message for pt to return call. X2  

## 2018-01-01 NOTE — Telephone Encounter (Signed)
Called and spoke with patient regarding results.  Informed the patient of results and recommendations today. Scheduled ROV with TP on 01/23/2018 at 4pm. Pt verbalized understanding and denied any questions or concerns at this time.  Nothing further needed.

## 2018-01-09 ENCOUNTER — Other Ambulatory Visit: Payer: Self-pay | Admitting: *Deleted

## 2018-01-09 DIAGNOSIS — R0683 Snoring: Secondary | ICD-10-CM

## 2018-01-23 ENCOUNTER — Encounter: Payer: Self-pay | Admitting: Adult Health

## 2018-01-23 ENCOUNTER — Ambulatory Visit (INDEPENDENT_AMBULATORY_CARE_PROVIDER_SITE_OTHER): Payer: Managed Care, Other (non HMO) | Admitting: Adult Health

## 2018-01-23 DIAGNOSIS — G4733 Obstructive sleep apnea (adult) (pediatric): Secondary | ICD-10-CM

## 2018-01-23 NOTE — Patient Instructions (Signed)
You have mild sleep apnea. Your score was 9.3 events /hr  (mild ) . You did drop your oxygen while sleeping to 82%  Refer to oral appliance .  May try Saline nasal rinses Twice daily   Snore strips. At bedtime   Positional sleep as discussed.  Follow up with Dr. Halford Chessman  Or Carlise Stofer NP in 4 months and As needed

## 2018-01-23 NOTE — Assessment & Plan Note (Signed)
Mild OSA  Pt education given   Plan  Patient Instructions  You have mild sleep apnea. Your score was 9.3 events /hr  (mild ) . You did drop your oxygen while sleeping to 82%  Refer to oral appliance .  May try Saline nasal rinses Twice daily   Snore strips. At bedtime   Positional sleep as discussed.  Follow up with Dr. Halford Chessman  Or Parrett NP in 4 months and As needed

## 2018-01-23 NOTE — Progress Notes (Signed)
@Patient  ID: William Norris, male    DOB: [birth year], 63 y.o.   MRN: [id number]  Chief Complaint  Patient presents with  . Follow-up    OSA     Referring provider: Dorena Cookey, MD  HPI: 63 year old male seen for sleep consult October 2019 found to have mild sleep apnea  TEST/EVENTS :  Home sleep study December 2019 showed mild sleep apnea AHI 9.3, SPO2 low 82%  01/23/2018 Follow up : OSA  Patient presents for a follow-up.  Patient was seen October 2019 for sleep consult.  Patient was having some daytime sleepiness feeling unrefreshed and restless sleep.  Patient had a home sleep study done in December 2019 that showed mild sleep apnea with a AHI at 9.3.  SPO2 low 82%. Discussed treatment options including oral appliance and CPAP.  Patient would like to proceed with sleep apnea   No Known Allergies  Immunization History  Administered Date(s) Administered  . Influenza Whole 10/10/2008  . Influenza, Seasonal, Injecte, Preservative Fre 12/19/2011  . Influenza,inj,Quad PF,6+ Mos 10/15/2014, 09/20/2017  . Td 01/11/2004  . Tdap 09/20/2017    Past Medical History:  Diagnosis Date  . Allergy   . Anxiety   . Cancer ([place])    skin cancer nose  . Cataract    forming   . Hemorrhoids   . Insomnia   . Internal hemorrhoids   . Migraine   . OSA (obstructive sleep apnea) 12/26/2017  . PONV (postoperative nausea and vomiting)   . Rectal leakage   . Skull fracture ([place])    no neurologic sequelae  . Torn Achilles tendon    right    Tobacco History: Social History   Tobacco Use  Smoking Status Never Smoker  Smokeless Tobacco Never Used   Counseling given: Not Answered   Outpatient Medications Prior to Visit  Medication Sig Dispense Refill  . Multiple Vitamins-Minerals (ANTIOXIDANT PO) Take 1 capsule by mouth daily.    . traZODone (DESYREL) 100 MG tablet Take 1 tablet (100 mg total) by mouth at bedtime. 1-1/2 tablets daily at bedtime (Patient taking differently: Take  150 mg by mouth at bedtime. 1-1/2 tablets daily at bedtime) 150 tablet 4  . zolmitriptan (ZOMIG-ZMT) 5 MG disintegrating tablet TAKE 1 TABLET BY MOUTH AS DIRECTED IF NEEDED FOR MIGRAINES 15 tablet 1   No facility-administered medications prior to visit.      Review of Systems:   Constitutional:   No  weight loss, night sweats,  Fevers, chills, fatigue, or  lassitude.  HEENT:   No headaches,  Difficulty swallowing,  Tooth/dental problems, or  Sore throat,                No sneezing, itching, ear ache, nasal congestion, post nasal drip,   CV:  No chest pain,  Orthopnea, PND, swelling in lower extremities, anasarca, dizziness, palpitations, syncope.   GI  No heartburn, indigestion, abdominal pain, nausea, vomiting, diarrhea, change in bowel habits, loss of appetite, bloody stools.   Resp: No shortness of breath with exertion or at rest.  No excess mucus, no productive cough,  No non-productive cough,  No coughing up of blood.  No change in color of mucus.  No wheezing.  No chest wall deformity  Skin: no rash or lesions.  GU: no dysuria, change in color of urine, no urgency or frequency.  No flank pain, no hematuria   MS:  No joint pain or swelling.  No decreased range of motion.  No back  pain.    Physical Exam  BP (!) 112/58 (BP Location: Left Arm, Cuff Size: Normal)   Pulse 73   Ht 5\' 7"  (1.702 m)   Wt 135 lb 9.6 oz (61.5 kg)   SpO2 94%   BMI 21.24 kg/m   GEN: A/Ox3; pleasant , NAD, well nourished    HEENT:  [place]/AT,  EACs-clear, TMs-wnl, NOSE-clear, THROAT-clear, no lesions, no postnasal drip or exudate noted. Class 2 MP airway   NECK:  Supple w/ fair ROM; no JVD; normal carotid impulses w/o bruits; no thyromegaly or nodules palpated; no lymphadenopathy.    RESP  Clear  P & A; w/o, wheezes/ rales/ or rhonchi. no accessory muscle use, no dullness to percussion  CARD:  RRR, no m/r/g, no peripheral edema, pulses intact, no cyanosis or clubbing.  GI:   Soft & nt; nml bowel  sounds; no organomegaly or masses detected.   Musco: Warm bil, no deformities or joint swelling noted.   Neuro: alert, no focal deficits noted.    Skin: Warm, no lesions or rashes    Lab Results:  CBC  BNP No results found for: BNP  ProBNP No results found for: PROBNP  Imaging: No results found.    No flowsheet data found.  No results found for: NITRICOXIDE      Assessment & Plan:   OSA (obstructive sleep apnea) Mild OSA  Pt education given   Plan  Patient Instructions  You have mild sleep apnea. Your score was 9.3 events /hr  (mild ) . You did drop your oxygen while sleeping to 82%  Refer to oral appliance .  May try Saline nasal rinses Twice daily   Snore strips. At bedtime   Positional sleep as discussed.  Follow up with Dr. Halford Chessman  Or Parrett NP in 4 months and As needed          Rexene Edison, NP 01/23/2018

## 2018-01-25 NOTE — Progress Notes (Signed)
Reviewed and agree with assessment/plan.   Merian Wroe, MD [facility] 01/06/2016, 12:24 PM Pager:  [phone]  

## 2018-05-25 ENCOUNTER — Ambulatory Visit: Payer: Managed Care, Other (non HMO) | Admitting: Pulmonary Disease

## 2018-07-04 ENCOUNTER — Other Ambulatory Visit: Payer: Self-pay | Admitting: Family Medicine

## 2018-07-04 DIAGNOSIS — G43009 Migraine without aura, not intractable, without status migrainosus: Secondary | ICD-10-CM

## 2018-07-04 MED ORDER — ZOLMITRIPTAN 5 MG PO TBDP: ORAL_TABLET | ORAL | 0 refills | Status: DC

## 2018-07-04 NOTE — Telephone Encounter (Signed)
TOC appointment made with Dr. Elease Hashimoto since patient prefers a male. Migraine  Medication refilled once with no refills

## 2018-07-04 NOTE — Telephone Encounter (Signed)
I am happy to see.

## 2018-07-04 NOTE — Telephone Encounter (Signed)
Medication Refill - Medication: zolmitriptan (ZOMIG-ZMT) 5 MG disintegrating tablet [[id number]]    Has the patient contacted their pharmacy? Yes.    (Agent: If yes, when and what did the pharmacy advise?)The pharmacy has requested this medication through fax twice with no response from the office. The patient is out of this medication.   Preferred Pharmacy (with phone number or street name):  [facility] [place], [address] AT [address] [phone] (Phone) [phone] (Fax)     Agent: Please be advised that RX refills may take up to 3 business days. We ask that you follow-up with your pharmacy.

## 2018-07-05 ENCOUNTER — Encounter: Payer: Managed Care, Other (non HMO) | Admitting: Family Medicine

## 2018-07-05 ENCOUNTER — Other Ambulatory Visit: Payer: Self-pay

## 2018-07-09 ENCOUNTER — Other Ambulatory Visit: Payer: Self-pay

## 2018-07-09 ENCOUNTER — Ambulatory Visit (INDEPENDENT_AMBULATORY_CARE_PROVIDER_SITE_OTHER): Payer: Managed Care, Other (non HMO) | Admitting: Family Medicine

## 2018-07-09 DIAGNOSIS — G4720 Circadian rhythm sleep disorder, unspecified type: Secondary | ICD-10-CM | POA: Diagnosis not present

## 2018-07-09 DIAGNOSIS — G43009 Migraine without aura, not intractable, without status migrainosus: Secondary | ICD-10-CM

## 2018-07-09 MED ORDER — ZOLMITRIPTAN 5 MG PO TBDP: ORAL_TABLET | ORAL | 11 refills | Status: DC

## 2018-07-09 NOTE — Progress Notes (Signed)
Patient ID: William Norris, male   DOB: 1955-03-22, 63 y.o.   MRN: [id number]  This visit type was conducted due to national recommendations for restrictions regarding the COVID-19 pandemic in an effort to limit this patient's exposure and mitigate transmission in our community.   Virtual Visit via Telephone Note  I connected with William Norris on 07/09/18 at  1:30 PM EDT by telephone and verified that I am speaking with the correct person using two identifiers.   I discussed the limitations, risks, security and privacy concerns of performing an evaluation and management service by telephone and the availability of in person appointments. I also discussed with the patient that there may be a patient responsible charge related to this service. The patient expressed understanding and agreed to proceed.  Location patient: home Location provider: work or home office Participants present for the call: patient, provider Patient did not have a visit in the prior 7 days to address this/these issue(s).   History of Present Illness:  Patient is reestablishing with me.  He is not new to this practice but is new to me.  He has history of migraine headaches without aura.  Generally 1 or 2 a month.  He takes Zomig-ZMT which works very well for him.  Is requesting refills.  He has history of chronic insomnia and takes trazodone for that which seems to working fairly well.  He thinks this is largely stress related.  He has been ruled out for obstructive sleep apnea.  Stays very active.  Does mountain biking and some running.  Developed some knee pain about 4 weeks ago.  Saw orthopedist and just had cortisone shot which seems to be helping some.   Observations/Objective: Patient sounds cheerful and well on the phone. I do not appreciate any SOB. Speech and thought processing are grossly intact. Patient reported vitals:  Assessment and Plan:  #1 history of migraine headaches without aura -Refill Zomig for 1  year  #2 history of chronic insomnia -Continue trazodone  Follow Up Instructions:  -Patient will set up complete physical at some point later this year  [id number] 5-10 [id number] 11-20 [id number] 21-30 I did not refer this patient for an OV in the next 24 hours for this/these issue(s).  I discussed the assessment and treatment plan with the patient. The patient was provided an opportunity to ask questions and all were answered. The patient agreed with the plan and demonstrated an understanding of the instructions.   The patient was advised to call back or seek an in-person evaluation if the symptoms worsen or if the condition fails to improve as anticipated.  I provided 21 minutes of non-face-to-face time during this encounter.   Carolann Littler, MD

## 2018-08-15 ENCOUNTER — Other Ambulatory Visit: Payer: Self-pay

## 2018-08-15 ENCOUNTER — Ambulatory Visit (INDEPENDENT_AMBULATORY_CARE_PROVIDER_SITE_OTHER): Payer: Managed Care, Other (non HMO) | Admitting: Family Medicine

## 2018-08-15 ENCOUNTER — Telehealth: Payer: Managed Care, Other (non HMO) | Admitting: Family Medicine

## 2018-08-15 DIAGNOSIS — L03116 Cellulitis of left lower limb: Secondary | ICD-10-CM

## 2018-08-15 MED ORDER — DOXYCYCLINE HYCLATE 100 MG PO CAPS: 100.0000 mg | ORAL_CAPSULE | Freq: Two times a day (BID) | ORAL | 0 refills | Status: DC

## 2018-08-15 NOTE — Progress Notes (Signed)
Patient ID: William Norris, male   DOB: [birth year], 63 y.o.   MRN: [id number]  This visit type was conducted due to national recommendations for restrictions regarding the COVID-19 pandemic in an effort to limit this patient's exposure and mitigate transmission in our community.   Virtual Visit via Video Note  I connected with Kimothy Kishimoto on 08/15/18 at  4:45 PM EDT by a video enabled telemedicine application and verified that I am speaking with the correct person using two identifiers.  Location patient: home Location provider:work or home office Persons participating in the virtual visit: patient, provider  I discussed the limitations of evaluation and management by telemedicine and the availability of in person appointments. The patient expressed understanding and agreed to proceed.   HPI: Patient thinks he may have had some type of bite left upper anterior thigh last Saturday.  He was working on a deck.  Did not actually observe any type of bite or sting.  He has had some progressive erythema and warmth since then.  Few days ago he noted what appeared to be a small pustular lesion but none since then.  He had some chills yesterday and earlier today temperature 100.7.  He denies any acute upper respiratory symptoms.  Denies any recent tick bites.  No history of MRSA.  Denies any tenderness to palpation.   ROS: See pertinent positives and negatives per HPI.  Past Medical History:  Diagnosis Date  . Allergy   . Anxiety   . Cancer ([place])    skin cancer nose  . Cataract    forming   . Hemorrhoids   . Insomnia   . Internal hemorrhoids   . Migraine   . OSA (obstructive sleep apnea) 12/26/2017  . PONV (postoperative nausea and vomiting)   . Rectal leakage   . Skull fracture ([place])    no neurologic sequelae  . Torn Achilles tendon    right    Past Surgical History:  Procedure Laterality Date  . ACHILLES TENDON REPAIR    . COLONOSCOPY    . HEMORRHOID BANDING    . KNEE CARTILAGE  SURGERY     x2  . SIGMOIDOSCOPY      Family History  Problem Relation Age of Onset  . Glaucoma Brother   . Osteoporosis Mother   . Irritable bowel syndrome Mother   . Colon cancer Neg Hx   . Colon polyps Neg Hx   . Esophageal cancer Neg Hx   . Rectal cancer Neg Hx   . Stomach cancer Neg Hx     SOCIAL HX: Non-smoker   Current Outpatient Medications:  .  doxycycline (VIBRAMYCIN) 100 MG capsule, Take 1 capsule (100 mg total) by mouth 2 (two) times daily., Disp: 20 capsule, Rfl: 0 .  Multiple Vitamins-Minerals (ANTIOXIDANT PO), Take 1 capsule by mouth daily., Disp: , Rfl:  .  traZODone (DESYREL) 100 MG tablet, Take 1 tablet (100 mg total) by mouth at bedtime. 1-1/2 tablets daily at bedtime (Patient taking differently: Take 150 mg by mouth at bedtime. 1-1/2 tablets daily at bedtime), Disp: 150 tablet, Rfl: 4 .  zolmitriptan (ZOMIG-ZMT) 5 MG disintegrating tablet, TAKE 1 TABLET BY MOUTH AS DIRECTED IF NEEDED FOR MIGRAINES, Disp: 6 tablet, Rfl: 11  EXAM:  VITALS per patient if applicable:  GENERAL: alert, oriented, appears well and in no acute distress  HEENT: atraumatic, conjunttiva clear, no obvious abnormalities on inspection of external nose and ears  NECK: normal movements of the head and neck  LUNGS:  on inspection no signs of respiratory distress, breathing rate appears normal, no obvious gross SOB, gasping or wheezing  CV: no obvious cyanosis  MS: moves all visible extremities without noticeable abnormality  PSYCH/NEURO: pleasant and cooperative, no obvious depression or anxiety, speech and thought processing grossly intact   Skin-patient has fairly large anterior erythema left upper anterior thigh with darker red center.  No obvious pustules.  ASSESSMENT AND PLAN:  Discussed the following assessment and plan:  Probable cellulitis left anterior thigh.  He does have some warmth and progressive erythema with reported low-grade fever earlier today.  Surprisingly though  he does not have any tenderness or pain.  Nontoxic in appearance  -Start doxycycline 100 mg twice daily -Follow-up immediately for any progressive fever, progressive redness, or other concerns     I discussed the assessment and treatment plan with the patient. The patient was provided an opportunity to ask questions and all were answered. The patient agreed with the plan and demonstrated an understanding of the instructions.   The patient was advised to call back or seek an in-person evaluation if the symptoms worsen or if the condition fails to improve as anticipated.    Carolann Littler, MD

## 2018-10-05 ENCOUNTER — Telehealth: Payer: Self-pay | Admitting: Family Medicine

## 2018-10-05 NOTE — Telephone Encounter (Signed)
Please advise 

## 2018-10-05 NOTE — Telephone Encounter (Signed)
States that he contacted the pharmacy 14 days ago to get this medication and the phamacy has not received any orders. Advised that this could be due to the pharmacy possibly sending it to last prescriber (Dr Sherren Mocha).  Please advise.  Medication Refill - Medication: traZODone (DESYREL) 100 MG tablet   Has the patient contacted their pharmacy? Yes.   (Agent: If no, request that the patient contact the pharmacy for the refill.) (Agent: If yes, when and what did the pharmacy advise?)  Preferred Pharmacy (with phone number or street name): [facility] [place], [address] AT [place]: Please be advised that RX refills may take up to 3 business days. We ask that you follow-up with your pharmacy.

## 2018-10-05 NOTE — Telephone Encounter (Signed)
Requested medication (s) are due for refill today: ye  Requested medication (s) are on the active medication list: yes  Last refill:  09/20/2017  Future visit scheduled: no  Notes to clinic:  Review for refill Patient requested medication 2 weeks ago   Requested Prescriptions  Pending Prescriptions Disp Refills   traZODone (DESYREL) 100 MG tablet 150 tablet 4    Sig: Take 1 tablet (100 mg total) by mouth at bedtime. 1-1/2 tablets daily at bedtime     Psychiatry: Antidepressants - Serotonin Modulator Failed - 10/05/2018  1:31 PM      Failed - Completed PHQ-2 or PHQ-9 in the last 360 days.      Passed - Valid encounter within last 6 months    Recent Outpatient Visits          1 month ago Cellulitis of left thigh   Therapist, music at [place], MD   2 months ago DYSFNCT ASSO W/SLEEP STGES/AROUSAL [place] at [facility], Alinda Sierras, MD   1 year ago Annual physical exam   Therapist, music at [place], [place]   2 years ago Upper back pain on right side   Therapist, music at [facility] [place], Malka So, MD   2 years ago [place] at [facility] [employer], [place], [place]

## 2018-10-08 NOTE — Telephone Encounter (Signed)
Refill for 6 months. 

## 2018-10-08 NOTE — Telephone Encounter (Signed)
Routing to Dr Elease Hashimoto for approval since this was rx'd over 1 year ago by another provider.

## 2018-10-09 NOTE — Telephone Encounter (Signed)
Rx called in per Dr. Elease Hashimoto. Clinic RN notified patient.

## 2018-10-09 NOTE — Telephone Encounter (Signed)
Pt calling to follow up on refill request.  Pt states he is not sleeping.

## 2019-08-04 ENCOUNTER — Other Ambulatory Visit: Payer: Self-pay | Admitting: Family Medicine

## 2019-08-04 DIAGNOSIS — G43009 Migraine without aura, not intractable, without status migrainosus: Secondary | ICD-10-CM

## 2019-09-14 ENCOUNTER — Other Ambulatory Visit: Payer: Self-pay | Admitting: Family Medicine

## 2019-09-14 DIAGNOSIS — G43009 Migraine without aura, not intractable, without status migrainosus: Secondary | ICD-10-CM

## 2019-10-24 ENCOUNTER — Other Ambulatory Visit: Payer: Self-pay | Admitting: Family Medicine

## 2019-10-25 NOTE — Telephone Encounter (Signed)
Refill for one month only.  Needs office follow up.

## 2019-10-25 NOTE — Telephone Encounter (Signed)
BB-Last Rx'ed was in 2019/plz advise/thx dmf

## 2019-10-29 NOTE — Telephone Encounter (Signed)
SA-Plz see Rx request/thx dmf

## 2019-11-26 ENCOUNTER — Other Ambulatory Visit: Payer: Self-pay | Admitting: Family Medicine

## 2019-11-26 DIAGNOSIS — G43009 Migraine without aura, not intractable, without status migrainosus: Secondary | ICD-10-CM

## 2020-01-30 ENCOUNTER — Other Ambulatory Visit: Payer: Self-pay | Admitting: Family Medicine

## 2020-02-02 ENCOUNTER — Other Ambulatory Visit: Payer: Self-pay | Admitting: Family Medicine

## 2020-02-02 DIAGNOSIS — G43009 Migraine without aura, not intractable, without status migrainosus: Secondary | ICD-10-CM

## 2020-03-08 ENCOUNTER — Other Ambulatory Visit: Payer: Self-pay | Admitting: Family Medicine

## 2020-03-08 DIAGNOSIS — G43009 Migraine without aura, not intractable, without status migrainosus: Secondary | ICD-10-CM

## 2020-03-10 NOTE — Telephone Encounter (Signed)
zolmitriptan (ZOMIG-ZMT) 5 MG disintegrating tablet   [id number] [place], [address] AT [place] Phone:  [phone]  Fax:  [phone]

## 2020-03-16 ENCOUNTER — Other Ambulatory Visit: Payer: Self-pay

## 2020-03-17 ENCOUNTER — Ambulatory Visit (INDEPENDENT_AMBULATORY_CARE_PROVIDER_SITE_OTHER): Payer: Managed Care, Other (non HMO) | Admitting: Family Medicine

## 2020-03-17 ENCOUNTER — Encounter: Payer: Self-pay | Admitting: Family Medicine

## 2020-03-17 DIAGNOSIS — G43009 Migraine without aura, not intractable, without status migrainosus: Secondary | ICD-10-CM | POA: Diagnosis not present

## 2020-03-17 DIAGNOSIS — F5104 Psychophysiologic insomnia: Secondary | ICD-10-CM | POA: Diagnosis not present

## 2020-03-17 MED ORDER — ZOLMITRIPTAN 5 MG PO TBDP: ORAL_TABLET | ORAL | 11 refills | Status: DC

## 2020-03-17 NOTE — Progress Notes (Signed)
Established Patient Office Visit  Subjective:  Patient ID: William Norris, male    DOB: Jul 19, 1955  Age: 65 y.o. MRN: [id number]  CC:  Chief Complaint  Patient presents with  . Medication Refill    HPI [name] presents for medication refills.  He has migraine headaches and generally about 2/month.  He takes Zomig 5 mg oral disintegrating tablet which generally works well for his migraines.  These are usually unilateral and retro-orbital.  No recent atypical features.  He has history of chronic insomnia.  Most nights takes 100 mg trazodone but occasionally supplements with extra 50 mg.  Does not need refills at this time.  Past Medical History:  Diagnosis Date  . Allergy   . Anxiety   . Cancer ([place])    skin cancer nose  . Cataract    forming   . Hemorrhoids   . Insomnia   . Internal hemorrhoids   . Migraine   . OSA (obstructive sleep apnea) 12/26/2017  . PONV (postoperative nausea and vomiting)   . Rectal leakage   . Skull fracture ([place])    no neurologic sequelae  . Torn Achilles tendon    right    Past Surgical History:  Procedure Laterality Date  . ACHILLES TENDON REPAIR    . COLONOSCOPY    . HEMORRHOID BANDING    . KNEE CARTILAGE SURGERY     x2  . SIGMOIDOSCOPY      Family History  Problem Relation Age of Onset  . Glaucoma Brother   . Osteoporosis Mother   . Irritable bowel syndrome Mother   . Colon cancer Neg Hx   . Colon polyps Neg Hx   . Esophageal cancer Neg Hx   . Rectal cancer Neg Hx   . Stomach cancer Neg Hx     Social History   Socioeconomic History  . Marital status: Married    Spouse name: Not on file  . Number of children: 1  . Years of education: Not on file  . Highest education level: Not on file  Occupational History  . Not on file  Tobacco Use  . Smoking status: Never Smoker  . Smokeless tobacco: Never Used  Vaping Use  . Vaping Use: Some days  Substance and Sexual Activity  . Alcohol use: Not Currently     Alcohol/week: 0.0 standard drinks    Comment: rare now, social before the last 3 yrs   . Drug use: No  . Sexual activity: Not on file  Other Topics Concern  . Not on file  Social History Narrative  . Not on file   Social Determinants of Health   Financial Resource Strain: Not on file  Food Insecurity: Not on file  Transportation Needs: Not on file  Physical Activity: Not on file  Stress: Not on file  Social Connections: Not on file  Intimate Partner Violence: Not on file    Outpatient Medications Prior to Visit  Medication Sig Dispense Refill  . Multiple Vitamins-Minerals (ANTIOXIDANT PO) Take 1 capsule by mouth daily.    Marland Kitchen tetracaine (PONTOCAINE) 0.5 % ophthalmic solution once.    . traZODone (DESYREL) 100 MG tablet TAKE 1 TO 1 AND 1/2 TABLETS BY MOUTH EVERY NIGHT AT BEDTIME (Patient taking differently: 150 mg.) 150 tablet 0  . zolmitriptan (ZOMIG-ZMT) 5 MG disintegrating tablet TAKE 1 TABLET BY MOUTH AS DIRECTED IF NEEDED FOR MIGRAINES 6 tablet 0  . doxycycline (VIBRAMYCIN) 100 MG capsule Take 1 capsule (100 mg total)  by mouth 2 (two) times daily. 20 capsule 0   No facility-administered medications prior to visit.    No Known Allergies  ROS Review of Systems  Constitutional: Negative for fatigue.  Eyes: Negative for visual disturbance.  Respiratory: Negative for cough, chest tightness and shortness of breath.   Cardiovascular: Negative for chest pain, palpitations and leg swelling.  Neurological: Negative for dizziness, syncope, weakness and light-headedness.  Psychiatric/Behavioral: Positive for sleep disturbance. Negative for dysphoric mood.      Objective:    Physical Exam Vitals reviewed.  Constitutional:      Appearance: Normal appearance.  Cardiovascular:     Rate and Rhythm: Normal rate and regular rhythm.  Pulmonary:     Effort: Pulmonary effort is normal.     Breath sounds: Normal breath sounds.  Neurological:     General: No focal deficit present.      Mental Status: He is alert.  Psychiatric:        Mood and Affect: Mood normal.        Thought Content: Thought content normal.     BP 110/70 (BP Location: Left Arm, Patient Position: Sitting, Cuff Size: Normal)   Pulse 63   Temp 98.2 F (36.8 C) (Oral)   Ht 5\' 7"  (1.702 m)   Wt 143 lb 12.8 oz (65.2 kg)   SpO2 95%   BMI 22.52 kg/m  Wt Readings from Last 3 Encounters:  03/17/20 143 lb 12.8 oz (65.2 kg)  01/23/18 135 lb 9.6 oz (61.5 kg)  11/28/17 135 lb (61.2 kg)     There are no preventive care reminders to display for this patient.  There are no preventive care reminders to display for this patient.  Lab Results  Component Value Date   TSH 2.34 09/20/2017   Lab Results  Component Value Date   WBC 3.6 (L) 09/20/2017   HGB 14.0 09/20/2017   HCT 41.1 09/20/2017   MCV 88.4 09/20/2017   PLT 139.0 (L) 09/20/2017   Lab Results  Component Value Date   NA 139 09/20/2017   K 4.4 09/20/2017   CO2 33 (H) 09/20/2017   GLUCOSE 79 09/20/2017   BUN 21 09/20/2017   CREATININE 1.03 09/20/2017   BILITOT 0.5 09/20/2017   ALKPHOS 30 (L) 09/20/2017   AST 25 09/20/2017   ALT 19 09/20/2017   PROT 6.5 09/20/2017   ALBUMIN 4.3 09/20/2017   CALCIUM 9.3 09/20/2017   GFR 78.06 09/20/2017   Lab Results  Component Value Date   CHOL 162 09/20/2017   Lab Results  Component Value Date   HDL 56.20 09/20/2017   Lab Results  Component Value Date   LDLCALC 94 09/20/2017   Lab Results  Component Value Date   TRIG 61.0 09/20/2017   Lab Results  Component Value Date   CHOLHDL 3 09/20/2017   Lab Results  Component Value Date   HGBA1C 5.9 02/22/2006      Assessment & Plan:   Problem List Items Addressed This Visit      Unprioritized   Chronic insomnia   Migraine without aura   Relevant Medications   zolmitriptan (ZOMIG-ZMT) 5 MG disintegrating tablet    Refilled Zomig for as needed use  Continue trazodone 100 mg nightly for insomnia  Schedule complete physical  soon  Meds ordered this encounter  Medications  . zolmitriptan (ZOMIG-ZMT) 5 MG disintegrating tablet    Sig: Take one at onset of migraine and may repeat once in two hours as needed but do  not take more than two in 24 hours.    Dispense:  6 tablet    Refill:  11    Follow-up: No follow-ups on file.    Carolann Littler, MD

## 2020-03-17 NOTE — Patient Instructions (Signed)
Set up complete physical at some point this year.   

## 2020-07-16 ENCOUNTER — Encounter: Payer: Self-pay | Admitting: Family Medicine

## 2020-07-16 ENCOUNTER — Telehealth (INDEPENDENT_AMBULATORY_CARE_PROVIDER_SITE_OTHER): Payer: Managed Care, Other (non HMO) | Admitting: Family Medicine

## 2020-07-16 DIAGNOSIS — R519 Headache, unspecified: Secondary | ICD-10-CM

## 2020-07-16 DIAGNOSIS — R059 Cough, unspecified: Secondary | ICD-10-CM | POA: Diagnosis not present

## 2020-07-16 DIAGNOSIS — R0981 Nasal congestion: Secondary | ICD-10-CM

## 2020-07-16 MED ORDER — BENZONATATE 100 MG PO CAPS: 100.0000 mg | ORAL_CAPSULE | Freq: Three times a day (TID) | ORAL | 0 refills | Status: DC | PRN

## 2020-07-16 MED ORDER — AMOXICILLIN-POT CLAVULANATE 875-125 MG PO TABS: 1.0000 | ORAL_TABLET | Freq: Two times a day (BID) | ORAL | 0 refills | Status: DC

## 2020-07-16 NOTE — Progress Notes (Signed)
Virtual Visit via Video Note  I connected with Ell  on 07/16/20 at  1:00 PM EDT by a video enabled telemedicine application and verified that I am speaking with the correct person using two identifiers.  Location patient: home, [place] Location provider:work or home office Persons participating in the virtual visit: patient, provider  I discussed the limitations of evaluation and management by telemedicine and the availability of in person appointments. The patient expressed understanding and agreed to proceed.   HPI:  Acute telemedicine visit for sinus issues: -Onset: almost 2 weeks; covid testing has been negative -recently traveled outside the country -Symptoms include: nasal congestion, cough, thick sinus congestion, some sinus discomfort, pressure in the ears - not improving and now starting to have some maxillary sinus pain -Denies:fevers, headaches, CP, SOB, NVD, inability to eat/drink/get out of bed -Pertinent past medical history:see below -Pertinent medication allergies:No Known Allergies -COVID-19 vaccine status: 2 doses + 2 boosters  ROS: See pertinent positives and negatives per HPI.  Past Medical History:  Diagnosis Date   Allergy    Anxiety    Cancer ([place])    skin cancer nose   Cataract    forming    Hemorrhoids    Insomnia    Internal hemorrhoids    Migraine    OSA (obstructive sleep apnea) 12/26/2017   PONV (postoperative nausea and vomiting)    Rectal leakage    Skull fracture ([facility])    no neurologic sequelae   Torn Achilles tendon    right    Past Surgical History:  Procedure Laterality Date   ACHILLES TENDON REPAIR     COLONOSCOPY     HEMORRHOID BANDING     KNEE CARTILAGE SURGERY     x2   SIGMOIDOSCOPY       Current Outpatient Medications:    amoxicillin-clavulanate (AUGMENTIN) 875-125 MG tablet, Take 1 tablet by mouth 2 (two) times daily., Disp: 20 tablet, Rfl: 0   benzonatate (TESSALON PERLES) 100 MG capsule, Take 1 capsule (100 mg total) by  mouth 3 (three) times daily as needed., Disp: 20 capsule, Rfl: 0   Multiple Vitamins-Minerals (ANTIOXIDANT PO), Take 1 capsule by mouth daily., Disp: , Rfl:    traZODone (DESYREL) 100 MG tablet, TAKE 1 TO 1 AND 1/2 TABLETS BY MOUTH EVERY NIGHT AT BEDTIME (Patient taking differently: 150 mg.), Disp: 150 tablet, Rfl: 0   zolmitriptan (ZOMIG-ZMT) 5 MG disintegrating tablet, Take one at onset of migraine and may repeat once in two hours as needed but do not take more than two in 24 hours., Disp: 6 tablet, Rfl: 11  EXAM:  VITALS per patient if applicable:  GENERAL: alert, oriented, appears well and in no acute distress  HEENT: atraumatic, conjunttiva clear, no obvious abnormalities on inspection of external nose and ears  NECK: normal movements of the head and neck  LUNGS: on inspection no signs of respiratory distress, breathing rate appears normal, no obvious gross SOB, gasping or wheezing  CV: no obvious cyanosis  MS: moves all visible extremities without noticeable abnormality  PSYCH/NEURO: pleasant and cooperative, no obvious depression or anxiety, speech and thought processing grossly intact  ASSESSMENT AND PLAN:  Discussed the following assessment and plan:  Facial discomfort  Nasal congestion  Cough  -we discussed possible serious and likely etiologies, options for evaluation and workup, limitations of telemedicine visit vs in person visit, treatment, treatment risks and precautions. Pt prefers to treat via telemedicine empirically rather than in person at this moment.  Query [facility], [phone] (  as did not test until > 1.5 weeks into symptoms), developing sinusitis vs other. Opted for nasal saline flushes, short course of nasal decongestant, Tessalon for cough and Augmentin 875 x 7-10 days if worsening or not improving with the other measures.  Work/School slipped offered:  declined Scheduled follow up with PCP offered: as needed Advised to seek prompt in person care if worsening,  new symptoms arise, or if is not improving with treatment. Discussed options for inperson care if PCP office not available. Did let this patient know that I only do telemedicine on Tuesdays and Thursdays for [place]. Advised to schedule follow up visit with PCP or [facility] if any further questions or concerns to avoid delays in care.   I discussed the assessment and treatment plan with the patient. The patient was provided an opportunity to ask questions and all were answered. The patient agreed with the plan and demonstrated an understanding of the instructions.     Lucretia Kern, DO

## 2020-07-16 NOTE — Patient Instructions (Signed)
-  I sent the medication(s) we discussed to your pharmacy: Meds ordered this encounter  Medications   amoxicillin-clavulanate (AUGMENTIN) 875-125 MG tablet    Sig: Take 1 tablet by mouth 2 (two) times daily.    Dispense:  20 tablet    Refill:  0   benzonatate (TESSALON PERLES) 100 MG capsule    Sig: Take 1 capsule (100 mg total) by mouth 3 (three) times daily as needed.    Dispense:  20 capsule    Refill:  0   Please try the nasal saline and Afrin nasal decongestant for 3 days, If worsening or not improving can try the antibiotic (Augmentin).   I hope you are feeling better soon!  Seek in person care promptly if your symptoms worsen, new concerns arise or you are not improving with treatment.  It was nice to meet you today. I help [facility] out with telemedicine visits on Tuesdays and Thursdays and am available for visits on those days. If you have any concerns or questions following this visit please schedule a follow up visit with your Primary Care doctor or seek care at a local urgent care clinic to avoid delays in care.

## 2020-07-27 ENCOUNTER — Other Ambulatory Visit: Payer: Self-pay | Admitting: Family Medicine

## 2020-10-19 ENCOUNTER — Ambulatory Visit (INDEPENDENT_AMBULATORY_CARE_PROVIDER_SITE_OTHER): Payer: Managed Care, Other (non HMO) | Admitting: Family Medicine

## 2020-10-19 ENCOUNTER — Other Ambulatory Visit: Payer: Self-pay

## 2020-10-19 VITALS — BP 110/60 | HR 80 | Temp 97.9°F | Wt 144.4 lb

## 2020-10-19 DIAGNOSIS — M7712 Lateral epicondylitis, left elbow: Secondary | ICD-10-CM

## 2020-10-19 NOTE — Progress Notes (Signed)
Established Patient Office Visit  Subjective:  Patient ID: William Norris, male    DOB: 11/12/1955  Age: 65 y.o. MRN: [id number]  CC:  Chief Complaint  Patient presents with   Elbow Injury    Left elbow, x 2 moths, pain started after lifting weights    HPI William Norris presents for left elbow pain for couple months.  He first noticed this after doing some weightlifting.  His pain is located over the lateral aspect of the elbow and is worse with grasping objects with his hand.  He has noticed worsening pain with wrist extension particularly.  No visible swelling.  Has not tried any icing or anything topically.  No neck pain.  No wrist pain.  No weakness.  He is right-hand dominant.  Past Medical History:  Diagnosis Date   Allergy    Anxiety    Cancer ([place])    skin cancer nose   Cataract    forming    Hemorrhoids    Insomnia    Internal hemorrhoids    Migraine    OSA (obstructive sleep apnea) 12/26/2017   PONV (postoperative nausea and vomiting)    Rectal leakage    Skull fracture (HCC)    no neurologic sequelae   Torn Achilles tendon    right    Past Surgical History:  Procedure Laterality Date   ACHILLES TENDON REPAIR     COLONOSCOPY     HEMORRHOID BANDING     KNEE CARTILAGE SURGERY     x2   SIGMOIDOSCOPY      Family History  Problem Relation Age of Onset   Glaucoma Brother    Osteoporosis Mother    Irritable bowel syndrome Mother    Colon cancer Neg Hx    Colon polyps Neg Hx    Esophageal cancer Neg Hx    Rectal cancer Neg Hx    Stomach cancer Neg Hx     Social History   Socioeconomic History   Marital status: Married    Spouse name: Not on file   Number of children: 1   Years of education: Not on file   Highest education level: Not on file  Occupational History   Not on file  Tobacco Use   Smoking status: Never   Smokeless tobacco: Never  Vaping Use   Vaping Use: Some days  Substance and Sexual Activity   Alcohol use: Not Currently     Alcohol/week: 0.0 standard drinks    Comment: rare now, social before the last 3 yrs    Drug use: No   Sexual activity: Not on file  Other Topics Concern   Not on file  Social History Narrative   Not on file   Social Determinants of Health   Financial Resource Strain: Not on file  Food Insecurity: Not on file  Transportation Needs: Not on file  Physical Activity: Not on file  Stress: Not on file  Social Connections: Not on file  Intimate Partner Violence: Not on file    Outpatient Medications Prior to Visit  Medication Sig Dispense Refill   amoxicillin-clavulanate (AUGMENTIN) 875-125 MG tablet Take 1 tablet by mouth 2 (two) times daily. 20 tablet 0   benzonatate (TESSALON PERLES) 100 MG capsule Take 1 capsule (100 mg total) by mouth 3 (three) times daily as needed. 20 capsule 0   Multiple Vitamins-Minerals (ANTIOXIDANT PO) Take 1 capsule by mouth daily.     traZODone (DESYREL) 100 MG tablet TAKE 1 TO 1 AND  1/2 TABLETS BY MOUTH EVERY NIGHT AT BEDTIME 150 tablet 0   zolmitriptan (ZOMIG-ZMT) 5 MG disintegrating tablet Take one at onset of migraine and may repeat once in two hours as needed but do not take more than two in 24 hours. 6 tablet 11   No facility-administered medications prior to visit.    No Known Allergies  ROS Review of Systems  Musculoskeletal:  Negative for neck pain.  Neurological:  Negative for weakness and numbness.     Objective:    Physical Exam Vitals reviewed.  Constitutional:      Appearance: Normal appearance.  Cardiovascular:     Rate and Rhythm: Normal rate and regular rhythm.  Pulmonary:     Effort: Pulmonary effort is normal.     Breath sounds: Normal breath sounds.  Musculoskeletal:     Comments: Left elbow reveals full range of motion.  No visible swelling.  No ecchymosis.  No erythema.  He does have some tenderness over the lateral epicondylar region with wrist extension against resistance.  No pain with wrist flexion against resistance.   No triceps tenderness.  No biceps tenderness.  Neurological:     Mental Status: He is alert.    BP 110/60 (BP Location: Left Arm, Patient Position: Sitting, Cuff Size: Normal)   Pulse 80   Temp 97.9 F (36.6 C) (Oral)   Wt 144 lb 6.4 oz (65.5 kg)   SpO2 97%   BMI 22.62 kg/m  Wt Readings from Last 3 Encounters:  10/19/20 144 lb 6.4 oz (65.5 kg)  03/17/20 143 lb 12.8 oz (65.2 kg)  01/23/18 135 lb 9.6 oz (61.5 kg)     Health Maintenance Due  Topic Date Due   Zoster Vaccines- Shingrix (1 of 2) Never done   INFLUENZA VACCINE  08/10/2020    There are no preventive care reminders to display for this patient.  Lab Results  Component Value Date   TSH 2.34 09/20/2017   Lab Results  Component Value Date   WBC 3.6 (L) 09/20/2017   HGB 14.0 09/20/2017   HCT 41.1 09/20/2017   MCV 88.4 09/20/2017   PLT 139.0 (L) 09/20/2017   Lab Results  Component Value Date   NA 139 09/20/2017   K 4.4 09/20/2017   CO2 33 (H) 09/20/2017   GLUCOSE 79 09/20/2017   BUN 21 09/20/2017   CREATININE 1.03 09/20/2017   BILITOT 0.5 09/20/2017   ALKPHOS 30 (L) 09/20/2017   AST 25 09/20/2017   ALT 19 09/20/2017   PROT 6.5 09/20/2017   ALBUMIN 4.3 09/20/2017   CALCIUM 9.3 09/20/2017   GFR 78.06 09/20/2017   Lab Results  Component Value Date   CHOL 162 09/20/2017   Lab Results  Component Value Date   HDL 56.20 09/20/2017   Lab Results  Component Value Date   LDLCALC 94 09/20/2017   Lab Results  Component Value Date   TRIG 61.0 09/20/2017   Lab Results  Component Value Date   CHOLHDL 3 09/20/2017   Lab Results  Component Value Date   HGBA1C 5.9 02/22/2006      Assessment & Plan:   Problem List Items Addressed This Visit   None Visit Diagnoses     Lateral epicondylitis of left elbow    -  Primary      -Try icing 15 to 20 minutes 2-3 times daily -Topical diclofenac gel 2-3 times daily -Consider steroid injection if not improving after the above  No orders of the  defined types  were placed in this encounter.   Follow-up: No follow-ups on file.    Carolann Littler, MD

## 2020-10-19 NOTE — Patient Instructions (Signed)
Consider trial of topical Diclofenac gel 2-3 times daily  Try some icing 20 minutes 2-3 times daily.

## 2020-10-28 ENCOUNTER — Encounter: Payer: Managed Care, Other (non HMO) | Admitting: Family Medicine

## 2020-11-02 ENCOUNTER — Other Ambulatory Visit: Payer: Self-pay

## 2020-11-02 ENCOUNTER — Ambulatory Visit (INDEPENDENT_AMBULATORY_CARE_PROVIDER_SITE_OTHER): Payer: Managed Care, Other (non HMO) | Admitting: Family Medicine

## 2020-11-02 ENCOUNTER — Encounter: Payer: Self-pay | Admitting: Family Medicine

## 2020-11-02 VITALS — BP 104/70 | HR 70 | Temp 97.8°F | Ht 67.0 in | Wt 143.1 lb

## 2020-11-02 DIAGNOSIS — Z0000 Encounter for general adult medical examination without abnormal findings: Secondary | ICD-10-CM

## 2020-11-02 DIAGNOSIS — R5383 Other fatigue: Secondary | ICD-10-CM | POA: Diagnosis not present

## 2020-11-02 NOTE — Progress Notes (Signed)
Established Patient Office Visit  Subjective:  Patient ID: William Norris, male    DOB: 09-Sep-1955  Age: 65 y.o. MRN: [id number]  CC:  Chief Complaint  Patient presents with   Annual Exam    HPI [name] presents for physical exam.  He is generally fairly healthy.  He does have history of chronic insomnia and is currently on trazodone which helps.  Still has some sleep disturbance.  Does not take any other regular medications.  He had history of migraine headaches in the past.  He does have some fatigue issues and wonders whether he may have low testosterone.  Normal libido.  Health maintenance reviewed:  -Flu vaccine already given -Tetanus due 2029 -Declines hepatitis C screening -COVID vaccines complete -He states he has had previous Shingrix but did not have dates  Social history-married for 32 years.  He has 41 year old son with special needs.  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  Never smoked.  No alcohol use.  Family history-mother had dementia presumably Alzheimer's type.  Father died age 36 cerebral aneurysm.  His father was a smoker.  He has a brother and sister who do not have any active medical problems  Past Medical History:  Diagnosis Date   Allergy    Anxiety    Cancer ([place])    skin cancer nose   Cataract    forming    Hemorrhoids    Insomnia    Internal hemorrhoids    Migraine    OSA (obstructive sleep apnea) 12/26/2017   PONV (postoperative nausea and vomiting)    Rectal leakage    Skull fracture ([facility])    no neurologic sequelae   Torn Achilles tendon    right    Past Surgical History:  Procedure Laterality Date   ACHILLES TENDON REPAIR     COLONOSCOPY     HEMORRHOID BANDING     KNEE CARTILAGE SURGERY     x2   SIGMOIDOSCOPY      Family History  Problem Relation Age of Onset   Osteoporosis Mother    Irritable bowel syndrome Mother    Dementia Mother    Cerebral aneurysm Father    Glaucoma Brother    Glaucoma  Paternal Grandmother    Colon cancer Neg Hx    Colon polyps Neg Hx    Esophageal cancer Neg Hx    Rectal cancer Neg Hx    Stomach cancer Neg Hx     Social History   Socioeconomic History   Marital status: Married    Spouse name: Not on file   Number of children: 1   Years of education: Not on file   Highest education level: Not on file  Occupational History   Not on file  Tobacco Use   Smoking status: Never   Smokeless tobacco: Never  Vaping Use   Vaping Use: Some days  Substance and Sexual Activity   Alcohol use: Not Currently    Alcohol/week: 0.0 standard drinks    Comment: rare now, social before the last 3 yrs    Drug use: No   Sexual activity: Not on file  Other Topics Concern   Not on file  Social History Narrative   Not on file   Social Determinants of Health   Financial Resource Strain: Not on file  Food Insecurity: Not on file  Transportation Needs: Not on file  Physical Activity: Not on file  Stress: Not on file  Social Connections:  Not on file  Intimate Partner Violence: Not on file    Outpatient Medications Prior to Visit  Medication Sig Dispense Refill   amoxicillin-clavulanate (AUGMENTIN) 875-125 MG tablet Take 1 tablet by mouth 2 (two) times daily. 20 tablet 0   Multiple Vitamins-Minerals (ANTIOXIDANT PO) Take 1 capsule by mouth daily.     traZODone (DESYREL) 100 MG tablet TAKE 1 TO 1 AND 1/2 TABLETS BY MOUTH EVERY NIGHT AT BEDTIME 150 tablet 0   zolmitriptan (ZOMIG-ZMT) 5 MG disintegrating tablet Take one at onset of migraine and may repeat once in two hours as needed but do not take more than two in 24 hours. 6 tablet 11   benzonatate (TESSALON PERLES) 100 MG capsule Take 1 capsule (100 mg total) by mouth 3 (three) times daily as needed. 20 capsule 0   No facility-administered medications prior to visit.    No Known Allergies  ROS Review of Systems  Constitutional:  Positive for fatigue. Negative for activity change, appetite change,  chills, fever and unexpected weight change.  HENT:  Negative for congestion, ear pain, sore throat and trouble swallowing.   Respiratory:  Negative for cough, shortness of breath, wheezing and stridor.   Cardiovascular:  Negative for chest pain and leg swelling.  Gastrointestinal:  Negative for abdominal pain.  Musculoskeletal:  Negative for arthralgias.  Skin:  Negative for rash.  Neurological:  Negative for syncope and headaches.  Hematological:  Negative for adenopathy.     Objective:    Physical Exam Vitals reviewed.  Constitutional:      General: He is not in acute distress.    Appearance: He is well-developed.  HENT:     Head: Normocephalic and atraumatic.     Right Ear: External ear normal.     Left Ear: External ear normal.  Eyes:     Conjunctiva/sclera: Conjunctivae normal.     Pupils: Pupils are equal, round, and reactive to light.  Neck:     Thyroid: No thyromegaly.  Cardiovascular:     Rate and Rhythm: Normal rate and regular rhythm.     Heart sounds: Normal heart sounds. No murmur heard. Pulmonary:     Effort: No respiratory distress.     Breath sounds: No wheezing or rales.  Abdominal:     General: Bowel sounds are normal. There is no distension.     Palpations: Abdomen is soft. There is no mass.     Tenderness: There is no abdominal tenderness. There is no guarding or rebound.  Musculoskeletal:     Cervical back: Normal range of motion and neck supple.     Right lower leg: No edema.     Left lower leg: No edema.  Lymphadenopathy:     Cervical: No cervical adenopathy.  Skin:    Findings: No rash.  Neurological:     Mental Status: He is alert and oriented to person, place, and time.     Cranial Nerves: No cranial nerve deficit.  Psychiatric:        Mood and Affect: Mood normal.        Thought Content: Thought content normal.    BP 104/70 (BP Location: Left Arm, Patient Position: Sitting, Cuff Size: Normal)   Pulse 70   Temp 97.8 F (36.6 C) (Oral)    Ht 5\' 7"  (1.702 m)   Wt 143 lb 1.6 oz (64.9 kg)   SpO2 98%   BMI 22.41 kg/m  Wt Readings from Last 3 Encounters:  11/02/20 143 lb 1.6 oz (64.9  kg)  10/19/20 144 lb 6.4 oz (65.5 kg)  03/17/20 143 lb 12.8 oz (65.2 kg)     Health Maintenance Due  Topic Date Due   Zoster Vaccines- Shingrix (1 of 2) Never done    There are no preventive care reminders to display for this patient.  Lab Results  Component Value Date   TSH 2.34 09/20/2017   Lab Results  Component Value Date   WBC 3.6 (L) 09/20/2017   HGB 14.0 09/20/2017   HCT 41.1 09/20/2017   MCV 88.4 09/20/2017   PLT 139.0 (L) 09/20/2017   Lab Results  Component Value Date   NA 139 09/20/2017   K 4.4 09/20/2017   CO2 33 (H) 09/20/2017   GLUCOSE 79 09/20/2017   BUN 21 09/20/2017   CREATININE 1.03 09/20/2017   BILITOT 0.5 09/20/2017   ALKPHOS 30 (L) 09/20/2017   AST 25 09/20/2017   ALT 19 09/20/2017   PROT 6.5 09/20/2017   ALBUMIN 4.3 09/20/2017   CALCIUM 9.3 09/20/2017   GFR 78.06 09/20/2017   Lab Results  Component Value Date   CHOL 162 09/20/2017   Lab Results  Component Value Date   HDL 56.20 09/20/2017   Lab Results  Component Value Date   LDLCALC 94 09/20/2017   Lab Results  Component Value Date   TRIG 61.0 09/20/2017   Lab Results  Component Value Date   CHOLHDL 3 09/20/2017   Lab Results  Component Value Date   HGBA1C 5.9 02/22/2006      Assessment & Plan:   Problem List Items Addressed This Visit   None Visit Diagnoses     Physical exam    -  Primary   Relevant Orders   Basic metabolic panel   Lipid panel   CBC with Differential/Platelet   TSH   Hepatic function panel   PSA   Fatigue, unspecified type       Relevant Orders   Testosterone     Generally healthy 65 year old male.  He does complain of some low energy at times and he thinks some of this may be related to poor sleep quality.  Does not have any issues with low libido.  -He is requesting testosterone level and  we recommended early morning value.  He will set up early morning labs -Flu vaccine already given -He will try to get [place] the dates of prior shingles vaccine -Other health maintenance up-to-date  No orders of the defined types were placed in this encounter.   Follow-up: No follow-ups on file.    Carolann Littler, MD

## 2020-11-06 ENCOUNTER — Other Ambulatory Visit: Payer: Self-pay

## 2020-11-09 ENCOUNTER — Other Ambulatory Visit: Payer: Self-pay

## 2020-11-09 ENCOUNTER — Other Ambulatory Visit (INDEPENDENT_AMBULATORY_CARE_PROVIDER_SITE_OTHER): Payer: Managed Care, Other (non HMO)

## 2020-11-09 DIAGNOSIS — Z0000 Encounter for general adult medical examination without abnormal findings: Secondary | ICD-10-CM

## 2020-11-09 DIAGNOSIS — R5383 Other fatigue: Secondary | ICD-10-CM | POA: Diagnosis not present

## 2020-11-09 LAB — BASIC METABOLIC PANEL
BUN: 19 mg/dL (ref 6–23)
CO2: 31 mEq/L (ref 19–32)
Calcium: 8.9 mg/dL (ref 8.4–10.5)
Chloride: 106 mEq/L (ref 96–112)
Creatinine, Ser: 1.17 mg/dL (ref 0.40–1.50)
GFR: 66.11 mL/min (ref >60.00)
Glucose, Bld: 98 mg/dL (ref 70–99)
Potassium: 4.6 mEq/L (ref 3.5–5.1)
Sodium: 143 mEq/L (ref 135–145)

## 2020-11-09 LAB — CBC WITH DIFFERENTIAL/PLATELET
Basophils Absolute: 0 10*3/uL (ref 0.0–0.1)
Basophils Relative: 1 % (ref 0.0–3.0)
Eosinophils Absolute: 0.1 10*3/uL (ref 0.0–0.7)
Eosinophils Relative: 1.7 % (ref 0.0–5.0)
HCT: 41.6 % (ref 39.0–52.0)
Hemoglobin: 13.7 g/dL (ref 13.0–17.0)
Lymphocytes Relative: 31.7 % (ref 12.0–46.0)
Lymphs Abs: 1.5 10*3/uL (ref 0.7–4.0)
MCHC: 33 g/dL (ref 30.0–36.0)
MCV: 89.5 fl (ref 78.0–100.0)
Monocytes Absolute: 0.6 10*3/uL (ref 0.1–1.0)
Monocytes Relative: 13.4 % — ABNORMAL HIGH (ref 3.0–12.0)
Neutro Abs: 2.4 10*3/uL (ref 1.4–7.7)
Neutrophils Relative %: 52.2 % (ref 43.0–77.0)
Platelets: 147 10*3/uL — ABNORMAL LOW (ref 150.0–400.0)
RBC: 4.64 Mil/uL (ref 4.22–5.81)
RDW: 13.9 % (ref 11.5–15.5)
WBC: 4.6 10*3/uL (ref 4.0–10.5)

## 2020-11-09 LAB — LIPID PANEL
Cholesterol: 199 mg/dL (ref 0–200)
HDL: 53.2 mg/dL (ref >39.00)
LDL Cholesterol: 134 mg/dL — ABNORMAL HIGH (ref 0–99)
NonHDL: 146.15
Total CHOL/HDL Ratio: 4
Triglycerides: 63 mg/dL (ref 0.0–149.0)
VLDL: 12.6 mg/dL (ref 0.0–40.0)

## 2020-11-09 LAB — PSA: PSA: 0.37 ng/mL (ref 0.10–4.00)

## 2020-11-09 LAB — HEPATIC FUNCTION PANEL
ALT: 22 U/L (ref 0–53)
AST: 26 U/L (ref 0–37)
Albumin: 4 g/dL (ref 3.5–5.2)
Alkaline Phosphatase: 28 U/L — ABNORMAL LOW (ref 39–117)
Bilirubin, Direct: 0.1 mg/dL (ref 0.0–0.3)
Total Bilirubin: 0.5 mg/dL (ref 0.2–1.2)
Total Protein: 6.1 g/dL (ref 6.0–8.3)

## 2020-11-09 LAB — TSH: TSH: 3.41 u[IU]/mL (ref 0.35–5.50)

## 2020-11-09 LAB — TESTOSTERONE: Testosterone: 428.64 ng/dL (ref 300.00–890.00)

## 2020-12-24 ENCOUNTER — Other Ambulatory Visit: Payer: Self-pay | Admitting: Family Medicine

## 2021-04-12 ENCOUNTER — Other Ambulatory Visit: Payer: Self-pay | Admitting: Family Medicine

## 2021-04-12 DIAGNOSIS — G43009 Migraine without aura, not intractable, without status migrainosus: Secondary | ICD-10-CM

## 2021-05-10 ENCOUNTER — Other Ambulatory Visit: Payer: Self-pay | Admitting: Family Medicine

## 2021-06-14 ENCOUNTER — Encounter: Payer: Self-pay | Admitting: Family Medicine

## 2021-06-14 ENCOUNTER — Ambulatory Visit (INDEPENDENT_AMBULATORY_CARE_PROVIDER_SITE_OTHER): Payer: Managed Care, Other (non HMO) | Admitting: Family Medicine

## 2021-06-14 VITALS — BP 100/60 | HR 65 | Temp 98.0°F | Ht 67.0 in | Wt 144.0 lb

## 2021-06-14 DIAGNOSIS — F5101 Primary insomnia: Secondary | ICD-10-CM

## 2021-06-14 MED ORDER — TRAZODONE HCL 100 MG PO TABS: ORAL_TABLET | ORAL | 11 refills | Status: DC

## 2021-06-14 NOTE — Progress Notes (Signed)
Established Patient Office Visit  Subjective   Patient ID: William Norris, male    DOB: Feb 16, 1955  Age: 66 y.o. MRN: [id number]  Chief Complaint  Patient presents with   Medication Consultation    HPI   Tobias called recently for refill of trazodone.  Apparently he was told he needed an appointment.  He was actually seen for physical back in October.  Lab work unremarkable at that time.  He has been on trazodone and takes 100 mg to occasional 150 mg at night for insomnia.  No difficulty falling asleep but has difficulty staying asleep.  Frequently wakes up after several hours and sometimes can get back to sleep for 3 hours or so.  He is considering possible retirement either end of this year or next year and would like to consider tapering off trazodone at that time.  No late the use of caffeine.  Exercises regularly.  Enjoys cycling.  No longer runs secondary to previous meniscus injuries  Past Medical History:  Diagnosis Date   Allergy    Anxiety    Cancer ([place])    skin cancer nose   Cataract    forming    Hemorrhoids    Insomnia    Internal hemorrhoids    Migraine    OSA (obstructive sleep apnea) 12/26/2017   PONV (postoperative nausea and vomiting)    Rectal leakage    Skull fracture ([facility])    no neurologic sequelae   Torn Achilles tendon    right   Past Surgical History:  Procedure Laterality Date   ACHILLES TENDON REPAIR     COLONOSCOPY     HEMORRHOID BANDING     KNEE CARTILAGE SURGERY     x2   SIGMOIDOSCOPY      reports that he has never smoked. He has never used smokeless tobacco. He reports that he does not currently use alcohol. He reports that he does not use drugs. family history includes Cerebral aneurysm in his father; Dementia in his mother; Glaucoma in his brother and paternal grandmother; Irritable bowel syndrome in his mother; Osteoporosis in his mother. No Known Allergies  Review of Systems  Constitutional:  Negative for chills and fever.   Respiratory:  Negative for shortness of breath.   Cardiovascular:  Negative for chest pain.  Psychiatric/Behavioral:  The patient has insomnia.      Objective:     BP 100/60 (BP Location: Left Arm, Patient Position: Sitting, Cuff Size: Normal)   Pulse 65   Temp 98 F (36.7 C) (Oral)   Ht '5\' 7"'$  (1.702 m)   Wt 144 lb (65.3 kg)   SpO2 99%   BMI 22.55 kg/m    Physical Exam Vitals reviewed.  Constitutional:      Appearance: Normal appearance.  Cardiovascular:     Rate and Rhythm: Normal rate and regular rhythm.  Pulmonary:     Effort: Pulmonary effort is normal.     Breath sounds: Normal breath sounds. No wheezing or rales.  Neurological:     Mental Status: He is alert.     No results found for any visits on 06/14/21.    The 10-year ASCVD risk score (Arnett DK, et al., 2019) is: 7.5%    Assessment & Plan:   Insomnia.  Refill trazodone 100 mg 1-1 and half tablets nightly.  Handout on insomnia given. Avoid benzodiazepines.  Follow-up for physical next Fall and sooner as needed  No follow-ups on file.    Carolann Littler, MD

## 2021-08-11 ENCOUNTER — Ambulatory Visit (INDEPENDENT_AMBULATORY_CARE_PROVIDER_SITE_OTHER): Payer: Managed Care, Other (non HMO) | Admitting: Family Medicine

## 2021-08-11 ENCOUNTER — Encounter: Payer: Self-pay | Admitting: Family Medicine

## 2021-08-11 VITALS — BP 96/60 | HR 70 | Temp 98.0°F | Ht 67.0 in | Wt 147.9 lb

## 2021-08-11 DIAGNOSIS — S5002XA Contusion of left elbow, initial encounter: Secondary | ICD-10-CM

## 2021-08-11 NOTE — Progress Notes (Signed)
Established Patient Office Visit  Subjective   Patient ID: William Norris, male    DOB: 1955-09-24  Age: 66 y.o. MRN: [id number]  Chief Complaint  Patient presents with   Bursitis    HPI   Larico is seen with some bruising around the left elbow.  He states that last Tuesday he just got back from a trip to [place].  He denies any injury.  He went to local urgent care on the 29th.  He was diagnosed with "olecranon bursitis ".  He does bring a picture he took on his cell phone of his elbow around that time and it did show some swelling around olecranon bursa.  He had worsening of bruising since then.  No significant pain.  No x-rays were done.  Apparently was started on Septra DS though there is no clear sign of infection.  He has had no fever or chills.  No warmth.  No erythema.  No pain with gripping.  No weakness.  No anticoagulant use.  No aspirin use.  No other bruising.  Past Medical History:  Diagnosis Date   Allergy    Anxiety    Cancer ([place])    skin cancer nose   Cataract    forming    Hemorrhoids    Insomnia    Internal hemorrhoids    Migraine    OSA (obstructive sleep apnea) 12/26/2017   PONV (postoperative nausea and vomiting)    Rectal leakage    Skull fracture ([facility])    no neurologic sequelae   Torn Achilles tendon    right   Past Surgical History:  Procedure Laterality Date   ACHILLES TENDON REPAIR     COLONOSCOPY     HEMORRHOID BANDING     KNEE CARTILAGE SURGERY     x2   SIGMOIDOSCOPY      reports that he has never smoked. He has never used smokeless tobacco. He reports that he does not currently use alcohol. He reports that he does not use drugs. family history includes Cerebral aneurysm in his father; Dementia in his mother; Glaucoma in his brother and paternal grandmother; Irritable bowel syndrome in his mother; Osteoporosis in his mother. No Known Allergies  Review of Systems  Constitutional:  Negative for fever.  Neurological:  Negative for weakness.       Objective:     BP 96/60 (BP Location: Left Arm, Patient Position: Sitting, Cuff Size: Normal)   Pulse 70   Temp 98 F (36.7 C) (Oral)   Ht '5\' 7"'$  (1.702 m)   Wt 147 lb 14.4 oz (67.1 kg)   SpO2 98%   BMI 23.16 kg/m  BP Readings from Last 3 Encounters:  08/11/21 96/60  06/14/21 100/60  11/02/20 104/70   Wt Readings from Last 3 Encounters:  08/11/21 147 lb 14.4 oz (67.1 kg)  06/14/21 144 lb (65.3 kg)  11/02/20 143 lb 1.6 oz (64.9 kg)      Physical Exam Vitals reviewed.  Constitutional:      Appearance: Normal appearance.  Cardiovascular:     Rate and Rhythm: Normal rate and regular rhythm.  Musculoskeletal:     Comments: Left elbow reveals fairly extensive bruising medially and extending toward the olecranon region.  There is no significant inflammation olecranon bursa at this time.  There is no bony tenderness whatsoever around the elbow region.  No pain with supination or pronation.  No biceps tenderness.  No triceps tenderness.  No pain with wrist flexion or extension against  resistance.  Neurological:     Mental Status: He is alert.      No results found for any visits on 08/11/21.    The 10-year ASCVD risk score (Arnett DK, et al., 2019) is: 7%    Assessment & Plan:   Contusion left elbow.  Sounds like he had some bursal inflammation as well which is now improving.  No known injury.  No bony tenderness.  Full range of motion elbow.  -We recommend observation for now. -Discontinue Septra as no evidence for cellulitis at this time. -Did not see any indication for x-rays with no injury and no bony tenderness.  No follow-ups on file.    Carolann Littler, MD

## 2021-08-11 NOTE — Patient Instructions (Signed)
Watch for any redness or increased warmth.

## 2021-08-15 ENCOUNTER — Other Ambulatory Visit: Payer: Self-pay | Admitting: Family Medicine

## 2021-08-15 DIAGNOSIS — G43009 Migraine without aura, not intractable, without status migrainosus: Secondary | ICD-10-CM

## 2021-10-04 ENCOUNTER — Encounter: Payer: Self-pay | Admitting: Family Medicine

## 2021-10-04 ENCOUNTER — Ambulatory Visit (INDEPENDENT_AMBULATORY_CARE_PROVIDER_SITE_OTHER): Payer: Managed Care, Other (non HMO) | Admitting: Family Medicine

## 2021-10-04 VITALS — BP 106/66 | HR 70 | Temp 97.9°F | Ht 67.0 in | Wt 146.3 lb

## 2021-10-04 DIAGNOSIS — M7022 Olecranon bursitis, left elbow: Secondary | ICD-10-CM | POA: Diagnosis not present

## 2021-10-04 NOTE — Progress Notes (Signed)
   Established Patient Office Visit  Subjective   Patient ID: William Norris, male    DOB: [birth year]  Age: 66 y.o. MRN: [id number]  Chief Complaint  Patient presents with   Bursitis    HPI   Left olecranon bursal swelling.  Had some swelling earlier in the summer which seem to go down but then recurred about 10 days ago.  He initially thought this may be related to mountain biking but does not recall specific injury.  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  No erythema.  No warmth.  No significant pain.  No history of gout.  No signs of acute inflammation.  No difficulties with flexing or extending the elbow.  Past Medical History:  Diagnosis Date   Allergy    Anxiety    Cancer ([place])    skin cancer nose   Cataract    forming    Hemorrhoids    Insomnia    Internal hemorrhoids    Migraine    OSA (obstructive sleep apnea) 12/26/2017   PONV (postoperative nausea and vomiting)    Rectal leakage    Skull fracture ([facility])    no neurologic sequelae   Torn Achilles tendon    right   Past Surgical History:  Procedure Laterality Date   ACHILLES TENDON REPAIR     COLONOSCOPY     HEMORRHOID BANDING     KNEE CARTILAGE SURGERY     x2   SIGMOIDOSCOPY      reports that he has never smoked. He has never used smokeless tobacco. He reports that he does not currently use alcohol. He reports that he does not use drugs. family history includes Cerebral aneurysm in his father; Dementia in his mother; Glaucoma in his brother and paternal grandmother; Irritable bowel syndrome in his mother; Osteoporosis in his mother. No Known Allergies  Review of Systems  Constitutional:  Negative for chills and fever.      Objective:     BP 106/66 (BP Location: Left Arm, Patient Position: Sitting, Cuff Size: Normal)   Pulse 70   Temp 97.9 F (36.6 C) (Oral)   Ht '5\' 7"'$  (1.702 m)   Wt 146 lb 4.8 oz (66.4 kg)   SpO2 99%   BMI 22.91 kg/m    Physical  Exam Vitals reviewed.  Constitutional:      Appearance: Normal appearance.  Cardiovascular:     Rate and Rhythm: Normal rate and regular rhythm.  Musculoskeletal:     Comments: Left elbow reveals minimal bursal swelling.  There is no erythema.  No warmth.  No visible ecchymosis.  Nontender.  Full range of motion left elbow.  Neurological:     Mental Status: He is alert.      No results found for any visits on 10/04/21.    The 10-year ASCVD risk score (Arnett DK, et al., 2019) is: 8.3%    Assessment & Plan:   Left olecranon bursitis, recurrent, with minimal swelling.  No signs of inflammation to suggest gout or infection.  -We offered aspiration and steroid injection but he declines this time. -Keep pressure off elbow as much as possible -Follow-up immediately for any erythema, warmth, or other concerns -We did discuss that even with aspiration these frequently recur.  No follow-ups on file.    Carolann Littler, MD

## 2021-12-20 ENCOUNTER — Ambulatory Visit (INDEPENDENT_AMBULATORY_CARE_PROVIDER_SITE_OTHER): Payer: Managed Care, Other (non HMO) | Admitting: Family Medicine

## 2021-12-20 VITALS — BP 120/70 | HR 80 | Temp 98.2°F | Ht 67.0 in | Wt 148.8 lb

## 2021-12-20 DIAGNOSIS — J019 Acute sinusitis, unspecified: Secondary | ICD-10-CM

## 2021-12-20 DIAGNOSIS — G43009 Migraine without aura, not intractable, without status migrainosus: Secondary | ICD-10-CM

## 2021-12-20 MED ORDER — AMOXICILLIN-POT CLAVULANATE 875-125 MG PO TABS: 1.0000 | ORAL_TABLET | Freq: Two times a day (BID) | ORAL | 0 refills | Status: DC

## 2021-12-20 MED ORDER — ZOLMITRIPTAN 5 MG PO TBDP: ORAL_TABLET | ORAL | 5 refills | Status: DC

## 2021-12-20 NOTE — Progress Notes (Signed)
Established Patient Office Visit  Subjective   Patient ID: William Norris, male    DOB: Feb 23, 1955  Age: 66 y.o. MRN: [id number]  Chief Complaint  Patient presents with   Cough    Patient complains of cough, x1 month, Productive cough with greenish sputum, Tried Mucinex with little releif    Sinusitis    X2 days, Nasal drainage    HPI   [name] is seen with progressive sinusitis symptoms really for several weeks.  He states he had COVID around November 10.  Seem to be improved somewhat but now has had relapse with frequent headaches and tremendous sinus pressure especially frontal and maxillary sinuses.  Occasional bloody discharge.  No fever.  He had some cough productive of greenish sputum as well.  No hemoptysis.  No body aches.  Tried Mucinex without relief.  He has history of migraine headaches and requesting refill of Zomig.  This generally works well for his migraine headaches.  Past Medical History:  Diagnosis Date   Allergy    Anxiety    Cancer ([place])    skin cancer nose   Cataract    forming    Hemorrhoids    Insomnia    Internal hemorrhoids    Migraine    OSA (obstructive sleep apnea) 12/26/2017   PONV (postoperative nausea and vomiting)    Rectal leakage    Skull fracture ([facility])    no neurologic sequelae   Torn Achilles tendon    right   Past Surgical History:  Procedure Laterality Date   ACHILLES TENDON REPAIR     COLONOSCOPY     HEMORRHOID BANDING     KNEE CARTILAGE SURGERY     x2   SIGMOIDOSCOPY      reports that he has never smoked. He has never used smokeless tobacco. He reports that he does not currently use alcohol. He reports that he does not use drugs. family history includes Cerebral aneurysm in his father; Dementia in his mother; Glaucoma in his brother and paternal grandmother; Irritable bowel syndrome in his mother; Osteoporosis in his mother. No Known Allergies  Review of Systems  Constitutional:  Positive for malaise/fatigue. Negative for  chills and fever.  HENT:  Positive for congestion and sinus pain.   Respiratory:  Positive for cough.   Cardiovascular:  Negative for chest pain.  Neurological:  Positive for headaches.      Objective:     BP 120/70 (BP Location: Left Arm, Patient Position: Sitting, Cuff Size: Normal)   Pulse 80   Temp 98.2 F (36.8 C) (Oral)   Ht '5\' 7"'$  (1.702 m)   Wt 148 lb 12.8 oz (67.5 kg)   SpO2 97%   BMI 23.31 kg/m    Physical Exam Vitals reviewed.  Constitutional:      General: He is not in acute distress.    Appearance: He is not ill-appearing.  HENT:     Right Ear: Tympanic membrane normal.     Left Ear: Tympanic membrane normal.  Cardiovascular:     Rate and Rhythm: Normal rate and regular rhythm.  Pulmonary:     Effort: Pulmonary effort is normal.     Breath sounds: No wheezing or rales.  Musculoskeletal:     Cervical back: Neck supple.  Neurological:     Mental Status: He is alert.      No results found for any visits on 12/20/21.    The 10-year ASCVD risk score (Arnett DK, et al., 2019) is: 11%  Assessment & Plan:   #1 acute sinusitis frontal and maxillary sinuses.  He has been battling sinus issues really ever since he got over [place] back in early November.  Go ahead given duration and start Augmentin 875 mg twice daily for 10 days. -Follow-up for any persistent or worsening symptoms -Consider humidifier in bedroom at night -Stay well-hydrated  #2 history of migraine headaches.  Requesting refill of Zomig.  This was sent in   No follow-ups on file.    Carolann Littler, MD

## 2022-03-28 ENCOUNTER — Other Ambulatory Visit: Payer: Self-pay | Admitting: Family Medicine

## 2022-03-28 DIAGNOSIS — G43009 Migraine without aura, not intractable, without status migrainosus: Secondary | ICD-10-CM

## 2022-05-04 ENCOUNTER — Other Ambulatory Visit: Payer: Self-pay | Admitting: Family Medicine

## 2022-05-04 DIAGNOSIS — G43009 Migraine without aura, not intractable, without status migrainosus: Secondary | ICD-10-CM

## 2022-06-16 ENCOUNTER — Other Ambulatory Visit: Payer: Self-pay | Admitting: Family Medicine

## 2022-07-18 ENCOUNTER — Ambulatory Visit: Payer: No Typology Code available for payment source | Admitting: Family Medicine

## 2022-07-18 ENCOUNTER — Encounter: Payer: Self-pay | Admitting: Family Medicine

## 2022-07-18 VITALS — BP 100/62 | HR 70 | Temp 98.3°F | Ht 67.0 in | Wt 148.5 lb

## 2022-07-18 DIAGNOSIS — H9221 Otorrhagia, right ear: Secondary | ICD-10-CM

## 2022-07-18 NOTE — Progress Notes (Signed)
Established Patient Office Visit  Subjective   Patient ID: William Norris, male    DOB: 04-09-1955  Age: 68 y.o. MRN: [id number]  Chief Complaint  Patient presents with   Ear Fullness    Patient complains of ear fullness, x2 weeks, Patient reports pain and blood in ear    HPI   William Norris is seen with some right ear fullness for the past couple weeks.  He uses earplugs at night.  Denies any injury.  1 night he was pulling out his ear plug and noticed low bit of blood and perhaps very mild discomfort that 1 night but no pain since then.  He denies any hearing changes.  He notices when water gets in his ear feels like there is a swishing type sensation and perhaps some trapped water in the right canal.  No left ear symptoms.  No recent flying.  Past Medical History:  Diagnosis Date   Allergy    Anxiety    Cancer ([facility])    skin cancer nose   Cataract    forming    Hemorrhoids    Insomnia    Internal hemorrhoids    Migraine    OSA (obstructive sleep apnea) 12/26/2017   PONV (postoperative nausea and vomiting)    Rectal leakage    Skull fracture ([facility])    no neurologic sequelae   Torn Achilles tendon    right   Past Surgical History:  Procedure Laterality Date   ACHILLES TENDON REPAIR     COLONOSCOPY     HEMORRHOID BANDING     KNEE CARTILAGE SURGERY     x2   SIGMOIDOSCOPY      reports that he has never smoked. He has never used smokeless tobacco. He reports that he does not currently use alcohol. He reports that he does not use drugs. family history includes Cerebral aneurysm in his father; Dementia in his mother; Glaucoma in his brother and paternal grandmother; Irritable bowel syndrome in his mother; Osteoporosis in his mother. No Known Allergies  Review of Systems  Constitutional:  Negative for fever.  HENT:  Negative for congestion, hearing loss and tinnitus.       Objective:     BP 100/62 (BP Location: Left Arm, Patient Position: Sitting, Cuff Size: Normal)    Pulse 70   Temp 98.3 F (36.8 C) (Oral)   Ht 5\' 7"  (1.702 m)   Wt 148 lb 8 oz (67.4 kg)   SpO2 96%   BMI 23.26 kg/m    Physical Exam Vitals reviewed.  Constitutional:      General: He is not in acute distress.    Appearance: Normal appearance.  HENT:     Ears:     Comments: Left ear canal is clear.  Left TM no acute changes. Right canal reveals some clotted blood along the inferior portion of the ear canal.  About one half of the eardrum is visualized with no obvious perforation but the other half is obscured by clotted blood in the canal.  No masses noted.  No active bleeding.  Clotted blood is dark maroon-colored.  No cerumen Cardiovascular:     Rate and Rhythm: Normal rate and regular rhythm.  Neurological:     Mental Status: He is alert.      No results found for any visits on 07/18/22.    The 10-year ASCVD risk score (Arnett DK, et al., 2019) is: 8.1%    Assessment & Plan:   Blood in right ear  canal along the inferior portion.  May have inadvertently traumatized the canal with earplug.  Portion of eardrum visualized reveals no perforation.  -Suggest consider leaving out earplugs for now until further healed -We hesitated to try to scoop out the blood because we explained this may reinitiate some bleeding from the canal -Consider a couple drops of rubbing alcohol to help evaporate any retained moisture in the ear canal  Evelena Peat, MD

## 2022-07-18 NOTE — Patient Instructions (Signed)
Dried blood in right inferior canal.  Consider few drops of rubbing/white alcohol to help evaporate any moisture after showers.

## 2022-11-16 ENCOUNTER — Encounter: Payer: Self-pay | Admitting: Family Medicine

## 2022-11-16 ENCOUNTER — Ambulatory Visit: Payer: No Typology Code available for payment source | Admitting: Family Medicine

## 2022-11-16 VITALS — BP 110/78 | HR 79 | Temp 98.5°F | Wt 147.0 lb

## 2022-11-16 DIAGNOSIS — J029 Acute pharyngitis, unspecified: Secondary | ICD-10-CM

## 2022-11-16 DIAGNOSIS — J019 Acute sinusitis, unspecified: Secondary | ICD-10-CM | POA: Diagnosis not present

## 2022-11-16 DIAGNOSIS — R059 Cough, unspecified: Secondary | ICD-10-CM | POA: Diagnosis not present

## 2022-11-16 LAB — POCT RAPID STREP A (OFFICE): Rapid Strep A Screen: NEGATIVE

## 2022-11-16 LAB — POC COVID19 BINAXNOW: SARS Coronavirus 2 Ag: NEGATIVE

## 2022-11-16 MED ORDER — AZITHROMYCIN 250 MG PO TABS: ORAL_TABLET | ORAL | 0 refills | Status: DC

## 2022-11-16 NOTE — Addendum Note (Signed)
Addended by: Carola Rhine on: 11/16/2022 05:24 PM   Modules accepted: Orders

## 2022-11-16 NOTE — Progress Notes (Signed)
   Subjective:    Patient ID: William Norris, male    DOB: [birth year], 67 y.o.   MRN: [id number]  HPI Here for 3 days of sinus pressure, PND, ST, and a dry cough. No fever.    Review of Systems  Constitutional: Negative.   HENT:  Positive for congestion, postnasal drip, sinus pressure and sore throat. Negative for ear pain.   Eyes: Negative.   Respiratory:  Positive for cough. Negative for shortness of breath and wheezing.        Objective:   Physical Exam Constitutional:      Appearance: Normal appearance.  HENT:     Right Ear: Tympanic membrane, ear canal and external ear normal.     Left Ear: Tympanic membrane, ear canal and external ear normal.     Nose: Nose normal.     Mouth/Throat:     Pharynx: Oropharynx is clear.  Eyes:     Conjunctiva/sclera: Conjunctivae normal.  Pulmonary:     Effort: Pulmonary effort is normal.     Breath sounds: Normal breath sounds.  Lymphadenopathy:     Cervical: No cervical adenopathy.  Neurological:     Mental Status: He is alert.           Assessment & Plan:  Sinusitis, treat with a Zpack. Add Mucinex D as needed. Gershon Crane, MD

## 2023-02-25 ENCOUNTER — Other Ambulatory Visit: Payer: Self-pay | Admitting: Family Medicine

## 2023-02-25 DIAGNOSIS — G43009 Migraine without aura, not intractable, without status migrainosus: Secondary | ICD-10-CM

## 2023-08-18 ENCOUNTER — Other Ambulatory Visit: Payer: Self-pay | Admitting: Family Medicine

## 2023-08-18 DIAGNOSIS — G43009 Migraine without aura, not intractable, without status migrainosus: Secondary | ICD-10-CM

## 2023-09-28 ENCOUNTER — Other Ambulatory Visit: Payer: Self-pay | Admitting: Family Medicine

## 2023-09-28 DIAGNOSIS — G43009 Migraine without aura, not intractable, without status migrainosus: Secondary | ICD-10-CM

## 2023-10-02 NOTE — Telephone Encounter (Signed)
 Yes ok

## 2023-11-06 ENCOUNTER — Other Ambulatory Visit: Payer: Self-pay | Admitting: Family Medicine

## 2023-11-06 DIAGNOSIS — G43009 Migraine without aura, not intractable, without status migrainosus: Secondary | ICD-10-CM

## 2023-11-08 ENCOUNTER — Ambulatory Visit: Admitting: Family Medicine

## 2023-11-08 ENCOUNTER — Encounter: Payer: Self-pay | Admitting: Family Medicine

## 2023-11-08 VITALS — BP 100/70 | HR 66 | Temp 97.9°F | Wt 146.6 lb

## 2023-11-08 DIAGNOSIS — G43009 Migraine without aura, not intractable, without status migrainosus: Secondary | ICD-10-CM | POA: Diagnosis not present

## 2023-11-08 DIAGNOSIS — F5104 Psychophysiologic insomnia: Secondary | ICD-10-CM

## 2023-11-08 DIAGNOSIS — Z0184 Encounter for antibody response examination: Secondary | ICD-10-CM | POA: Diagnosis not present

## 2023-11-08 DIAGNOSIS — M545 Low back pain, unspecified: Secondary | ICD-10-CM

## 2023-11-08 DIAGNOSIS — Z23 Encounter for immunization: Secondary | ICD-10-CM | POA: Diagnosis not present

## 2023-11-08 DIAGNOSIS — L821 Other seborrheic keratosis: Secondary | ICD-10-CM | POA: Diagnosis not present

## 2023-11-08 MED ORDER — ZOLMITRIPTAN 5 MG PO TBDP: ORAL_TABLET | ORAL | 11 refills | Status: AC

## 2023-11-08 MED ORDER — TRAZODONE HCL 100 MG PO TABS: 100.0000 mg | ORAL_TABLET | Freq: Every day | ORAL | 3 refills | Status: AC

## 2023-11-08 NOTE — Progress Notes (Signed)
 Established Patient Office Visit  Subjective   Patient ID: William Norris, male    DOB: [birth year]  Age: 68 y.o. MRN: [id number]  Chief Complaint  Patient presents with   Immunizations    HPI   William Norris is here today to discuss multiple issues as follows  Upcoming trip to [place] in January.  He does not have any history of documented hepatitis A.  Tetanus is up-to-date.  He is unsure regarding MMR status and would like to consider blood work versus immunization for that.  He feels like he will have low risk for typhoid.  He is not sure but does not think he will be going to [place] for malaria.  He will check to confirm.  Chronic insomnia.  Has been on trazodone  100 mg nightly and that dosage seems to work.  Requesting refill.  He has migraine headaches currently controlled with Zomig .  Heavy exertion and exercise seems to be 1 trigger.  Zomig  is currently working well for acute treatment  He noticed brownish spot right anterior shoulder and requested evaluation.  He does see a dermatologist but will not see them for several months.  Low back pain which is somewhat chronic left lower lumbar region.  Present for well over a year.  No radiculitis symptoms.  Denies any specific injury.  Pain is somewhat dull and currently 2 out of 10 intensity with changes with time.  No radiculitis symptoms.  Past Medical History:  Diagnosis Date   Allergy    Anxiety    Cancer ([facility])    skin cancer nose   Cataract    forming    Hemorrhoids    Insomnia    Internal hemorrhoids    Migraine    OSA (obstructive sleep apnea) 12/26/2017   PONV (postoperative nausea and vomiting)    Rectal leakage    Skull fracture ([facility])    no neurologic sequelae   Torn Achilles tendon    right   Past Surgical History:  Procedure Laterality Date   ACHILLES TENDON REPAIR     COLONOSCOPY     HEMORRHOID BANDING     KNEE CARTILAGE SURGERY     x2   SIGMOIDOSCOPY      reports that he has never smoked.  He has never used smokeless tobacco. He reports that he does not currently use alcohol. He reports that he does not use drugs. family history includes Cerebral aneurysm in his father; Dementia in his mother; Glaucoma in his brother and paternal grandmother; Irritable bowel syndrome in his mother; Osteoporosis in his mother. No Known Allergies  Review of Systems  Constitutional:  Negative for chills, fever and malaise/fatigue.  Eyes:  Negative for blurred vision.  Respiratory:  Negative for shortness of breath.   Cardiovascular:  Negative for chest pain.  Musculoskeletal:  Positive for back pain.  Neurological:  Negative for dizziness, weakness and headaches.      Objective:     BP 100/70   Pulse 66   Temp 97.9 F (36.6 C) (Oral)   Wt 146 lb 9.6 oz (66.5 kg)   SpO2 97%   BMI 22.96 kg/m  BP Readings from Last 3 Encounters:  11/08/23 100/70  11/16/22 110/78  07/18/22 100/62   Wt Readings from Last 3 Encounters:  11/08/23 146 lb 9.6 oz (66.5 kg)  11/16/22 147 lb (66.7 kg)  07/18/22 148 lb 8 oz (67.4 kg)      Physical Exam Vitals reviewed.  Constitutional:  Appearance: He is well-developed.  HENT:     Right Ear: External ear normal.     Left Ear: External ear normal.  Eyes:     Pupils: Pupils are equal, round, and reactive to light.  Neck:     Thyroid : No thyromegaly.  Cardiovascular:     Rate and Rhythm: Normal rate and regular rhythm.  Pulmonary:     Effort: Pulmonary effort is normal. No respiratory distress.     Breath sounds: Normal breath sounds. No wheezing or rales.  Musculoskeletal:     Cervical back: Neck supple.     Comments: Straight leg raises are negative.  Left hip reveals good range of motion.  No spinal tenderness.  Skin:    Comments: Brown well-demarcated scaly skin lesion right anterior shoulder.  About 6 mm diameter.  Neurological:     Mental Status: He is alert and oriented to person, place, and time.     Comments: Full strength left  lower extremity with plantarflexion, dorsiflexion, knee extension      No results found for any visits on 11/08/23.  Last CBC Lab Results  Component Value Date   WBC 4.6 11/09/2020   HGB 13.7 11/09/2020   HCT 41.6 11/09/2020   MCV 89.5 11/09/2020   RDW 13.9 11/09/2020   PLT 147.0 (L) 11/09/2020   Last metabolic panel Lab Results  Component Value Date   GLUCOSE 98 11/09/2020   NA 143 11/09/2020   K 4.6 11/09/2020   CL 106 11/09/2020   CO2 31 11/09/2020   BUN 19 11/09/2020   CREATININE 1.17 11/09/2020   GFR 66.11 11/09/2020   CALCIUM 8.9 11/09/2020   PROT 6.1 11/09/2020   ALBUMIN 4.0 11/09/2020   BILITOT 0.5 11/09/2020   ALKPHOS 28 (L) 11/09/2020   AST 26 11/09/2020   ALT 22 11/09/2020   Last lipids Lab Results  Component Value Date   CHOL 199 11/09/2020   HDL 53.20 11/09/2020   LDLCALC 134 (H) 11/09/2020   LDLDIRECT 165.5 01/29/2009   TRIG 63.0 11/09/2020   CHOLHDL 4 11/09/2020   Last hemoglobin A1c Lab Results  Component Value Date   HGBA1C 5.9 02/22/2006      The 10-year ASCVD risk score (Arnett DK, et al., 2019) is: 9.4%    Assessment & Plan:   #1 travel advice encounter.  Patient has upcoming trip to [place].  He will check to see if [place] he is going to requires malaria prophylaxis.  He is not sure regarding MMR status and would like to test antibodies and lab order placed for that.  We did recommend hepatitis A vaccine and get second booster in 6 months.  #2 chronic insomnia.  Refill trazodone  100 mg nightly.  Patient aware of principles of good sleep hygiene  #3 migraine headaches.  Responds well to Zomig  for acute treatment.  Refill provided  #4 benign-appearing seborrheic keratosis right anterior shoulder.  Reassurance given  #5 chronic left lower lumbar back pain relief for at least couple years.  No radiculitis symptoms.  Nonfocal exam.  Set up trial of physical therapy.  If not improving with that consider plain films and possible  referral to sports medicine.   No follow-ups on file.    Wolm Scarlet, MD

## 2023-11-09 LAB — MEASLES/MUMPS/RUBELLA IMMUNITY
Mumps IgG: >300 [AU]/ml
Rubella: >33 {index}
Rubeola IgG: >300 [AU]/ml

## 2023-11-10 ENCOUNTER — Ambulatory Visit: Payer: Self-pay | Admitting: Family Medicine

## 2023-12-11 ENCOUNTER — Ambulatory Visit (HOSPITAL_BASED_OUTPATIENT_CLINIC_OR_DEPARTMENT_OTHER): Attending: Family Medicine | Admitting: Physical Therapy

## 2024-01-06 ENCOUNTER — Encounter: Payer: Self-pay | Admitting: Family Medicine

## 2024-01-09 MED ORDER — ATOVAQUONE-PROGUANIL HCL 250-100 MG PO TABS: ORAL_TABLET | ORAL | 0 refills | Status: AC

## 2024-01-09 NOTE — Telephone Encounter (Signed)
 Prescription for Malarone sent  Wolm LELON Scarlet MD [facility]

## 2024-02-06 ENCOUNTER — Ambulatory Visit: Admitting: Family Medicine

## 2024-02-06 ENCOUNTER — Ambulatory Visit: Payer: Self-pay

## 2024-02-06 ENCOUNTER — Encounter: Payer: Self-pay | Admitting: Family Medicine

## 2024-02-06 VITALS — BP 90/66 | HR 67 | Temp 97.8°F | Wt 147.9 lb

## 2024-02-06 DIAGNOSIS — R058 Other specified cough: Secondary | ICD-10-CM

## 2024-02-06 DIAGNOSIS — J019 Acute sinusitis, unspecified: Secondary | ICD-10-CM | POA: Diagnosis not present

## 2024-02-06 MED ORDER — AMOXICILLIN-POT CLAVULANATE 875-125 MG PO TABS: 1.0000 | ORAL_TABLET | Freq: Two times a day (BID) | ORAL | 0 refills | Status: AC

## 2024-02-06 MED ORDER — BENZONATATE 100 MG PO CAPS: 100.0000 mg | ORAL_CAPSULE | Freq: Three times a day (TID) | ORAL | 0 refills | Status: AC | PRN

## 2024-02-06 NOTE — Telephone Encounter (Signed)
 FYI Only or Action Required?: FYI only for provider: appointment scheduled on 02/06/24.  Patient was last seen in primary care on 11/08/2023 by Micheal Wolm ORN, MD.  Called Nurse Triage reporting Cough.  Symptoms began 1-2 weeks.  Interventions attempted: OTC medications: Mucinex and Other: [place] medication from [place], Tessalon  from someone else.  Symptoms are: stable.  Triage Disposition: See Physician Within 24 Hours  Patient/caregiver understands and will follow disposition?: Yes                                  1. ONSET: When did the cough begin?      1-2 weeks  2. SEVERITY: How bad is the cough today?      Moderate to severe, sporadic coughing spells 3. SPUTUM: Describe the color of your sputum (e.g., none, dry cough; clear, white, yellow, green)     Green/yellow 4. HEMOPTYSIS: Are you coughing up any blood? If Yes, ask: How much? (e.g., flecks, streaks, tablespoons, etc.)     Denies 5. DIFFICULTY BREATHING: Are you having difficulty breathing? If Yes, ask: How bad is it? (e.g., mild, moderate, severe)      Denies 6. FEVER: Do you have a fever? If Yes, ask: What is your temperature, how was it measured, and when did it start?     Denies 7. CARDIAC HISTORY: Do you have any history of heart disease? (e.g., heart attack, congestive heart failure)      Denies 8. LUNG HISTORY: Do you have any history of lung disease?  (e.g., pulmonary embolus, asthma, emphysema)     Denies 10. OTHER SYMPTOMS: Do you have any other symptoms? (e.g., runny nose, wheezing, chest pain)     Chest congestion, head and sinus congestion Denies: chest pain, wheezing  Reason for Disposition  [1] Continuous (nonstop) coughing interferes with work or school AND [2] no improvement using cough treatment per Care Advice  Protocols used: Cough - Acute Productive-A-AH  Reason for Triage: Bad cough. Coughing up green stuff. Would like an  appt.

## 2024-02-06 NOTE — Progress Notes (Signed)
 "  Established Patient Office Visit  Subjective   Patient ID: William Norris, male    DOB: 08-31-1955  Age: 69 y.o. MRN: [id number]  Chief Complaint  Patient presents with   Cough   Sinusitis    HPI   William Norris is seen today with onset of respiratory illness around the middle January.  He just got back yesterday from being out of country.  He was in [place].  He feels like he may have picked up something on his way to that country.  He has had some ongoing productive cough and sinusitis symptoms with bilateral maxillary facial pain.  Just recently started taking some Mucinex DM.  Denies any fevers or chills.  No dyspnea.  He has facial pressure maxillary region.  Cough productive of greenish sputum.  No pleuritic pain.  Denies any nausea, vomiting, or diarrhea.  No chronic lung issues.  Non-smoker  Past Medical History:  Diagnosis Date   Allergy    Anxiety    Cancer ([facility])    skin cancer nose   Cataract    forming    Hemorrhoids    Insomnia    Internal hemorrhoids    Migraine    OSA (obstructive sleep apnea) 12/26/2017   PONV (postoperative nausea and vomiting)    Rectal leakage    Skull fracture (HCC)    no neurologic sequelae   Torn Achilles tendon    right   Past Surgical History:  Procedure Laterality Date   ACHILLES TENDON REPAIR     COLONOSCOPY     HEMORRHOID BANDING     KNEE CARTILAGE SURGERY     x2   SIGMOIDOSCOPY      reports that he has never smoked. He has never used smokeless tobacco. He reports that he does not currently use alcohol. He reports that he does not use drugs. family history includes Cerebral aneurysm in his father; Dementia in his mother; Glaucoma in his brother and paternal grandmother; Irritable bowel syndrome in his mother; Osteoporosis in his mother. Allergies[1]  Review of Systems  Constitutional:  Negative for chills and fever.  HENT:  Positive for sinus pain. Negative for sore throat.   Respiratory:  Positive for cough and sputum  production. Negative for hemoptysis, shortness of breath and wheezing.   Cardiovascular:  Negative for chest pain.      Objective:     BP 90/66   Pulse 67   Temp 97.8 F (36.6 C) (Oral)   Wt 147 lb 14.4 oz (67.1 kg)   SpO2 97%   BMI 23.16 kg/m  BP Readings from Last 3 Encounters:  02/06/24 90/66  11/08/23 100/70  11/16/22 110/78   Wt Readings from Last 3 Encounters:  02/06/24 147 lb 14.4 oz (67.1 kg)  11/08/23 146 lb 9.6 oz (66.5 kg)  11/16/22 147 lb (66.7 kg)      Physical Exam Vitals reviewed.  Constitutional:      General: He is not in acute distress.    Appearance: He is not ill-appearing.  HENT:     Right Ear: Tympanic membrane normal.     Left Ear: Tympanic membrane normal.     Mouth/Throat:     Mouth: Mucous membranes are moist.     Pharynx: Oropharynx is clear. No oropharyngeal exudate or posterior oropharyngeal erythema.  Cardiovascular:     Rate and Rhythm: Normal rate and regular rhythm.  Pulmonary:     Effort: Pulmonary effort is normal.     Breath sounds: Normal  breath sounds. No wheezing or rales.  Musculoskeletal:     Cervical back: Neck supple.  Neurological:     Mental Status: He is alert.      No results found for any visits on 02/06/24.    The ASCVD Risk score (Arnett DK, et al., 2019) failed to calculate for the following reasons:   Cannot find a previous HDL lab   Cannot find a previous total cholesterol lab   * - Cholesterol units were assumed    Assessment & Plan:   Productive cough probably secondary to acute sinusitis.  We discussed fact this may be viral but he is getting near the 2-week mark without much improvement.  We printed prescription for Augmentin  875 mg twice daily for 10 days to start if he is not seeing any additional improvement next couple days.  Tessalon  Perles 100 mg every 8 hours as needed for cough.  Follow-up promptly for any fever or worsening symptoms  Wolm Scarlet, MD     [1] No Known  Allergies  "
